# Patient Record
Sex: Male | Born: 1965 | Race: White | Hispanic: No | Marital: Married | Smoking: Never smoker
Health system: Southern US, Community
[De-identification: ages and names within clinical notes are randomized; demographics above are authoritative.]

## PROBLEM LIST (undated history)

## (undated) DIAGNOSIS — E785 Hyperlipidemia, unspecified: Secondary | ICD-10-CM

## (undated) HISTORY — PX: SHOULDER SURGERY: SHX246

---

## 2011-02-15 ENCOUNTER — Ambulatory Visit (HOSPITAL_BASED_OUTPATIENT_CLINIC_OR_DEPARTMENT_OTHER)
Admission: RE | Admit: 2011-02-15 | Discharge: 2011-02-15 | Disposition: A | Discharge status: Home / Self Care | Payer: Worker's Compensation | Source: Ambulatory Visit | Attending: Specialist | Admitting: Specialist

## 2011-02-15 DIAGNOSIS — M25819 Other specified joint disorders, unspecified shoulder: Secondary | ICD-10-CM | POA: Insufficient documentation

## 2011-02-21 ENCOUNTER — Other Ambulatory Visit (HOSPITAL_COMMUNITY): Payer: Self-pay | Admitting: Specialist

## 2011-02-21 ENCOUNTER — Ambulatory Visit (HOSPITAL_COMMUNITY)
Admission: RE | Admit: 2011-02-21 | Discharge: 2011-02-21 | Disposition: A | Discharge status: Home / Self Care | Payer: Worker's Compensation | Source: Ambulatory Visit | Attending: Specialist | Admitting: Specialist

## 2011-02-21 ENCOUNTER — Other Ambulatory Visit: Payer: Self-pay | Admitting: Specialist

## 2011-02-21 ENCOUNTER — Encounter (HOSPITAL_COMMUNITY): Payer: Worker's Compensation

## 2011-02-21 DIAGNOSIS — M7541 Impingement syndrome of right shoulder: Secondary | ICD-10-CM

## 2011-02-21 DIAGNOSIS — Z01818 Encounter for other preprocedural examination: Secondary | ICD-10-CM | POA: Insufficient documentation

## 2011-02-21 DIAGNOSIS — Z01812 Encounter for preprocedural laboratory examination: Secondary | ICD-10-CM | POA: Insufficient documentation

## 2011-02-21 LAB — CBC
Hemoglobin: 14.2 g/dL (ref 13.0–17.0)
MCH: 31.5 pg (ref 26.0–34.0)
MCHC: 33.3 g/dL (ref 30.0–36.0)
Platelets: 331 10*3/uL (ref 150–400)
RBC: 4.51 MIL/uL (ref 4.22–5.81)

## 2011-02-21 LAB — SURGICAL PCR SCREEN
MRSA, PCR: NEGATIVE
Staphylococcus aureus: NEGATIVE

## 2011-02-21 LAB — BASIC METABOLIC PANEL
Calcium: 9.1 mg/dL (ref 8.4–10.5)
Creatinine, Ser: 0.62 mg/dL (ref 0.4–1.5)
GFR calc Af Amer: >60 mL/min (ref >60)
GFR calc non Af Amer: >60 mL/min (ref >60)
Sodium: 140 mEq/L (ref 135–145)

## 2011-02-28 ENCOUNTER — Ambulatory Visit (HOSPITAL_COMMUNITY)
Admission: RE | Admit: 2011-02-28 | Discharge: 2011-02-28 | Disposition: A | Discharge status: Home / Self Care | Payer: Worker's Compensation | Source: Ambulatory Visit | Attending: Specialist | Admitting: Specialist

## 2011-02-28 DIAGNOSIS — M719 Bursopathy, unspecified: Secondary | ICD-10-CM | POA: Insufficient documentation

## 2011-02-28 DIAGNOSIS — M659 Unspecified synovitis and tenosynovitis, unspecified site: Secondary | ICD-10-CM | POA: Insufficient documentation

## 2011-02-28 DIAGNOSIS — M25819 Other specified joint disorders, unspecified shoulder: Secondary | ICD-10-CM | POA: Insufficient documentation

## 2011-02-28 DIAGNOSIS — Z01812 Encounter for preprocedural laboratory examination: Secondary | ICD-10-CM | POA: Insufficient documentation

## 2011-02-28 DIAGNOSIS — M67919 Unspecified disorder of synovium and tendon, unspecified shoulder: Secondary | ICD-10-CM | POA: Insufficient documentation

## 2011-02-28 DIAGNOSIS — M24119 Other articular cartilage disorders, unspecified shoulder: Secondary | ICD-10-CM | POA: Insufficient documentation

## 2011-02-28 DIAGNOSIS — E119 Type 2 diabetes mellitus without complications: Secondary | ICD-10-CM | POA: Insufficient documentation

## 2011-02-28 LAB — GLUCOSE, CAPILLARY: Glucose-Capillary: 146 mg/dL — ABNORMAL HIGH (ref 70–99)

## 2011-03-11 NOTE — Op Note (Signed)
NAME:  Shawn Anthony, Shawn Anthony                 ACCOUNT NO.:  [id number]  MEDICAL RECORD NO.:  [id number]           PATIENT TYPE:  O  LOCATION:  DAYL                         FACILITY:  [facility]  PHYSICIAN:  Jene Every, M.D.    DATE OF BIRTH:  [birth year]  DATE OF PROCEDURE: DATE OF DISCHARGE:  02/28/2011                              OPERATIVE REPORT   PREOPERATIVE DIAGNOSIS:  Impingement syndrome and rotator cuff tear of the right shoulder.  POSTOPERATIVE DIAGNOSIS:  Large tear of the rotator cuff, labral tear.  PROCEDURE PERFORMED: 1. Right shoulder arthroscopy, debridement of superior and anterior     labral tear. 2. Subacromial decompression, bursectomy, debridement of partial tear     of the rotator cuff .  ANESTHESIA:  General.  BRIEF HISTORY:  Impingement pain, MRI indicating __________ bursitis, tendonitis, full rotator cuff tear with consistent pain despite conservative treatment.  He was indicated for arthroscopic evaluation, subacromial decompression, bursectomy.  Risks and benefits discussed including bleeding, infection, no change in symptoms, worsening symptoms, need for repeat debridement, DVT, PE, anesthetic complications, etc.  TECHNIQUE:  With the patient in supine position and beach-chair, after induction of adequate general anesthesia, 2 g of Kefzol, right shoulder and upper extremities were prepped and draped in usual sterile fashion. Surgical marker was utilized to delineate acromion and AC joint and coracoid.  Standard posterolateral and anterolateral incisions were made through the skin only for portal access. The arm was in 70/30 position and gentle traction and an arthroscopic cannula was then inserted through the glenohumeral joint in line with coracoid penetrating the capsule atraumatically.  Irrigant was then utilized to insufflate the joint and under direct visualization, an 18-gauge needle was inserted anteriorly to localize an anterior portal between the  coracoid and anterolateral aspect of the acromion.  Just underneath the biceps tendon, we made a small incision in the skin only and advanced the cannula beneath biceps tendons into the glenohumeral space.  There was significant degenerative fraying of the labrum superiorly and anteriorly.  This was debrided to a stable base and probed.  There was no detachment of the labrum.  Biceps was unremarkable.  There was some minimal fraying of the rotator cuff from beneath this vantage point. Subscapularis was unremarkable.  Minimal of chondral lesions of the glenohumeral joint.  After examining the labrum to determine instability, we redirected camera in the subacromial space and triangulating with a lateral portal in the subacromial space, noted was exuberant hypertrophic bursitis and synovitis.  We introduced the shaver laterally and performed a full bursectomy anteriorly, posteriorly, laterally, and superiorly with some shaving anterolaterally.  There was a minimal spur at the anterolateral aspect of the acromion.  This was shaved.  There was a fair amount of subacromial space.  We did not feel that full release of the __________ ligament was required.  After full bursectomy, there was just small fraying of the anterolateral aspect of the rotator cuff near the insertion of supraspinatus.  This was lightly debrided to good bleeding tissue, may be 10% of the rotator cuff was involved.  It was extensively probed to determine that.  Good space was noted from bursectomy that was performed for evaluation and no further pathology, arthroscopic intervention and therefore, removed all instrumentation, portals were closed with 4-0 nylon simple sutures. Marcaine 0.25% with epinephrine was infiltrated in the joint and dressed accordingly, placed a sling, extubated without difficulty and transported to the recovery room in satisfactory condition.  The patient tolerated the procedure well.  No  complications.     Jene Every, M.D.     Cordelia Pen  D:  02/28/2011  T:  03/01/2011  Job:  [id number]  Electronically Signed by Jene Every M.D. on 03/11/2011 02:24:15 PM

## 2011-12-11 ENCOUNTER — Encounter (HOSPITAL_COMMUNITY): Payer: Self-pay | Admitting: *Deleted

## 2011-12-11 ENCOUNTER — Emergency Department (INDEPENDENT_AMBULATORY_CARE_PROVIDER_SITE_OTHER)
Admission: EM | Admit: 2011-12-11 | Discharge: 2011-12-11 | Disposition: A | Discharge status: Home / Self Care | Payer: BC Managed Care – PPO | Source: Home / Self Care | Attending: Family Medicine | Admitting: Family Medicine

## 2011-12-11 DIAGNOSIS — J310 Chronic rhinitis: Secondary | ICD-10-CM

## 2011-12-11 MED ORDER — HYDROCOD POLST-CHLORPHEN POLST 10-8 MG/5ML PO LQCR: 5.0000 mL | Freq: Two times a day (BID) | ORAL | Status: DC | PRN

## 2011-12-11 MED ORDER — FLUTICASONE PROPIONATE 50 MCG/ACT NA SUSP: 2.0000 | Freq: Every day | NASAL | Status: DC

## 2011-12-11 NOTE — ED Provider Notes (Signed)
History     CSN: [id number]  Arrival date & time 12/11/11  1228   First MD Initiated Contact with Patient 12/11/11 1342      Chief Complaint  Patient presents with  . Cough    (Consider location/radiation/quality/duration/timing/severity/associated sxs/prior treatment) HPI Comments: Shawn Anthony presents for evaluation of a persistent nonproductive cough over the last several weeks. He is been seen at to urgent care prior to this visit. On the first visit he was diagnosed with pneumonia and given antibiotics, including an injection. He was also given an injection of steroid at that time as well. On the second visit he was given a second round of antibiotics and an albuterol inhaler. He reports fever at the onset of symptoms during the first visit, but none since. He now reports nasal congestion, postnasal drainage, and persistent cough. He also reports a history of seasonal allergies.  Patient is a 46 y.o. male presenting with cough. The history is provided by the patient.  Cough This is a new problem. The current episode started more than 1 week ago. The problem occurs constantly. The problem has not changed since onset.The cough is non-productive. He is not a smoker.    Past Medical History  Diagnosis Date  . Diabetes mellitus     Past Surgical History  Procedure Date  . Shoulder surgery     History reviewed. No pertinent family history.  History  Substance Use Topics  . Smoking status: Not on file  . Smokeless tobacco: Not on file  . Alcohol Use: No      Review of Systems  Constitutional: Negative.   HENT: Positive for congestion.   Eyes: Negative.   Respiratory: Positive for cough.   Cardiovascular: Negative.   Gastrointestinal: Negative.   Genitourinary: Negative.   Musculoskeletal: Negative.   Skin: Negative.   Neurological: Negative.     Allergies  Review of patient's allergies indicates no known allergies.  Home Medications   Current Outpatient Rx  Name  Route Sig Dispense Refill  . ALBUTEROL SULFATE HFA 108 (90 BASE) MCG/ACT IN AERS Inhalation Inhale 2 puffs into the lungs every 6 (six) hours as needed.    Marland Kitchen GLIMEPIRIDE 2 MG PO TABS Oral Take 2 mg by mouth daily before breakfast.    . LEVOFLOXACIN 750 MG PO TABS Oral Take 750 mg by mouth daily.    Marland Kitchen METFORMIN HCL 500 MG PO TABS Oral Take 500 mg by mouth 2 (two) times daily with a meal.    . SIMVASTATIN 10 MG PO TABS Oral Take 10 mg by mouth at bedtime.    Marland Kitchen HYDROCOD POLST-CPM POLST ER 10-8 MG/5ML PO LQCR Oral Take 5 mLs by mouth every 12 (twelve) hours as needed. 140 mL 0  . FLUTICASONE PROPIONATE 50 MCG/ACT NA SUSP Nasal Place 2 sprays into the nose daily. 16 g 2    BP 105/68  Pulse 123  Temp(Src) 99.5 F (37.5 C) (Oral)  Resp 22  SpO2 95%  Physical Exam  Nursing note and vitals reviewed. Constitutional: He is oriented to person, place, and time. He appears well-developed and well-nourished.  HENT:  Head: Normocephalic and atraumatic.  Right Ear: Tympanic membrane is retracted.  Left Ear: Tympanic membrane is retracted.  Mouth/Throat: Uvula is midline, oropharynx is clear and moist and mucous membranes are normal.  Eyes: EOM are normal.  Neck: Normal range of motion.  Cardiovascular: Normal rate and regular rhythm.   Pulmonary/Chest: Effort normal and breath sounds normal. He has no wheezes.  He has no rales.  Musculoskeletal: Normal range of motion.  Neurological: He is alert and oriented to person, place, and time.  Skin: Skin is warm and dry.  Psychiatric: His behavior is normal.    ED Course  Procedures (including critical care time)  Labs Reviewed - No data to display No results found.   1. Rhinitis       MDM  rx given for fluticasone and Tussionex Pennkinetic syrup        Richardo Priest, MD 12/11/11 1517

## 2011-12-11 NOTE — ED Notes (Signed)
Pt  Has  Symptoms  Of  A  Non  Productive  Cough            Chest  Congestion          Seen   At  [facility]   Recently  For  pnuemonia            Pt  Was  Given   Anti  Biotic  Injections  As  Well  As  Breathing  tx        -  Pt  Reports  He  Is  Not  Getting  Any  Better

## 2014-06-18 ENCOUNTER — Emergency Department (HOSPITAL_COMMUNITY)
Admission: EM | Admit: 2014-06-18 | Discharge: 2014-06-19 | Disposition: A | Discharge status: Home / Self Care | Payer: 59 | Attending: Emergency Medicine | Admitting: Emergency Medicine

## 2014-06-18 ENCOUNTER — Emergency Department (HOSPITAL_COMMUNITY): Payer: 59

## 2014-06-18 ENCOUNTER — Encounter (HOSPITAL_COMMUNITY): Payer: Self-pay | Admitting: Emergency Medicine

## 2014-06-18 DIAGNOSIS — S20219A Contusion of unspecified front wall of thorax, initial encounter: Secondary | ICD-10-CM | POA: Insufficient documentation

## 2014-06-18 DIAGNOSIS — S20212A Contusion of left front wall of thorax, initial encounter: Secondary | ICD-10-CM

## 2014-06-18 DIAGNOSIS — S46909A Unspecified injury of unspecified muscle, fascia and tendon at shoulder and upper arm level, unspecified arm, initial encounter: Secondary | ICD-10-CM | POA: Insufficient documentation

## 2014-06-18 DIAGNOSIS — Y9389 Activity, other specified: Secondary | ICD-10-CM | POA: Insufficient documentation

## 2014-06-18 DIAGNOSIS — IMO0002 Reserved for concepts with insufficient information to code with codable children: Secondary | ICD-10-CM | POA: Insufficient documentation

## 2014-06-18 DIAGNOSIS — S4980XA Other specified injuries of shoulder and upper arm, unspecified arm, initial encounter: Secondary | ICD-10-CM | POA: Diagnosis present

## 2014-06-18 DIAGNOSIS — E119 Type 2 diabetes mellitus without complications: Secondary | ICD-10-CM | POA: Diagnosis not present

## 2014-06-18 DIAGNOSIS — Z79899 Other long term (current) drug therapy: Secondary | ICD-10-CM | POA: Diagnosis not present

## 2014-06-18 DIAGNOSIS — S46912A Strain of unspecified muscle, fascia and tendon at shoulder and upper arm level, left arm, initial encounter: Secondary | ICD-10-CM

## 2014-06-18 DIAGNOSIS — T148XXA Other injury of unspecified body region, initial encounter: Secondary | ICD-10-CM

## 2014-06-18 DIAGNOSIS — Y92410 Unspecified street and highway as the place of occurrence of the external cause: Secondary | ICD-10-CM | POA: Insufficient documentation
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
## 2014-06-19 NOTE — ED Provider Notes (Signed)
Medical screening examination/treatment/procedure(s) were performed by non-physician practitioner and as supervising physician I was immediately available for consultation/collaboration.   EKG Interpretation None       Shamari Trostel, MD 06/19/14 0053 

## 2017-11-16 ENCOUNTER — Inpatient Hospital Stay (HOSPITAL_COMMUNITY)
Admission: EM | Admit: 2017-11-16 | Discharge: 2017-11-23 | DRG: 638 | Disposition: A | Discharge status: Home / Self Care | Payer: BC Managed Care – PPO | Attending: Internal Medicine | Admitting: Internal Medicine

## 2017-11-16 ENCOUNTER — Encounter (HOSPITAL_COMMUNITY): Payer: Self-pay

## 2017-11-16 ENCOUNTER — Emergency Department (HOSPITAL_COMMUNITY): Payer: BC Managed Care – PPO

## 2017-11-16 ENCOUNTER — Inpatient Hospital Stay (HOSPITAL_COMMUNITY): Payer: BC Managed Care – PPO

## 2017-11-16 DIAGNOSIS — K219 Gastro-esophageal reflux disease without esophagitis: Secondary | ICD-10-CM | POA: Diagnosis present

## 2017-11-16 DIAGNOSIS — E878 Other disorders of electrolyte and fluid balance, not elsewhere classified: Secondary | ICD-10-CM | POA: Diagnosis present

## 2017-11-16 DIAGNOSIS — R000 Tachycardia, unspecified: Secondary | ICD-10-CM | POA: Diagnosis present

## 2017-11-16 DIAGNOSIS — R112 Nausea with vomiting, unspecified: Secondary | ICD-10-CM | POA: Diagnosis not present

## 2017-11-16 DIAGNOSIS — E1110 Type 2 diabetes mellitus with ketoacidosis without coma: Secondary | ICD-10-CM | POA: Diagnosis present

## 2017-11-16 DIAGNOSIS — E0810 Diabetes mellitus due to underlying condition with ketoacidosis without coma: Secondary | ICD-10-CM

## 2017-11-16 DIAGNOSIS — E860 Dehydration: Secondary | ICD-10-CM

## 2017-11-16 DIAGNOSIS — Z79899 Other long term (current) drug therapy: Secondary | ICD-10-CM | POA: Diagnosis not present

## 2017-11-16 DIAGNOSIS — D72829 Elevated white blood cell count, unspecified: Secondary | ICD-10-CM | POA: Diagnosis present

## 2017-11-16 DIAGNOSIS — Z7984 Long term (current) use of oral hypoglycemic drugs: Secondary | ICD-10-CM

## 2017-11-16 DIAGNOSIS — Z818 Family history of other mental and behavioral disorders: Secondary | ICD-10-CM

## 2017-11-16 DIAGNOSIS — E876 Hypokalemia: Secondary | ICD-10-CM | POA: Diagnosis present

## 2017-11-16 DIAGNOSIS — Z6841 Body Mass Index (BMI) 40.0 and over, adult: Secondary | ICD-10-CM | POA: Diagnosis not present

## 2017-11-16 DIAGNOSIS — K760 Fatty (change of) liver, not elsewhere classified: Secondary | ICD-10-CM | POA: Diagnosis present

## 2017-11-16 DIAGNOSIS — E1169 Type 2 diabetes mellitus with other specified complication: Secondary | ICD-10-CM | POA: Diagnosis not present

## 2017-11-16 DIAGNOSIS — G9340 Encephalopathy, unspecified: Secondary | ICD-10-CM | POA: Diagnosis present

## 2017-11-16 DIAGNOSIS — E785 Hyperlipidemia, unspecified: Secondary | ICD-10-CM | POA: Diagnosis present

## 2017-11-16 DIAGNOSIS — G9349 Other encephalopathy: Secondary | ICD-10-CM | POA: Diagnosis present

## 2017-11-16 HISTORY — DX: Hyperlipidemia, unspecified: E78.5

## 2017-11-16 LAB — URINALYSIS, ROUTINE W REFLEX MICROSCOPIC
BILIRUBIN URINE: NEGATIVE
Glucose, UA: >=500 mg/dL — AB
Hgb urine dipstick: NEGATIVE
Ketones, ur: 80 mg/dL — AB
LEUKOCYTES UA: NEGATIVE
Nitrite: NEGATIVE
PROTEIN: 30 mg/dL — AB
RBC / HPF: NONE SEEN RBC/hpf (ref 0–5)
Specific Gravity, Urine: 1.024 (ref 1.005–1.030)
pH: 5 (ref 5.0–8.0)

## 2017-11-16 LAB — BASIC METABOLIC PANEL
Anion gap: 19 — ABNORMAL HIGH (ref 5–15)
BUN: 24 mg/dL — AB (ref 6–20)
CALCIUM: 7.9 mg/dL — AB (ref 8.9–10.3)
CO2: 7 mmol/L — AB (ref 22–32)
Chloride: 103 mmol/L (ref 101–111)
Creatinine, Ser: 0.99 mg/dL (ref 0.61–1.24)
GFR calc Af Amer: >60 mL/min (ref >60)
GLUCOSE: 263 mg/dL — AB (ref 65–99)
Potassium: 5.1 mmol/L (ref 3.5–5.1)
Sodium: 129 mmol/L — ABNORMAL LOW (ref 135–145)

## 2017-11-16 LAB — CBC WITH DIFFERENTIAL/PLATELET
BASOS ABS: 0 10*3/uL (ref 0.0–0.1)
BASOS PCT: 0 %
EOS PCT: 0 %
Eosinophils Absolute: 0 10*3/uL (ref 0.0–0.7)
HCT: 50.5 % (ref 39.0–52.0)
Hemoglobin: 17 g/dL (ref 13.0–17.0)
LYMPHS PCT: 7 %
Lymphs Abs: 1.6 10*3/uL (ref 0.7–4.0)
MCH: 33 pg (ref 26.0–34.0)
MCHC: 33.7 g/dL (ref 30.0–36.0)
MCV: 98.1 fL (ref 78.0–100.0)
Monocytes Absolute: 1.4 10*3/uL — ABNORMAL HIGH (ref 0.1–1.0)
Monocytes Relative: 6 %
NEUTROS ABS: 21.8 10*3/uL — AB (ref 1.7–7.7)
Neutrophils Relative %: 87 %
PLATELETS: 385 10*3/uL (ref 150–400)
RBC: 5.15 MIL/uL (ref 4.22–5.81)
RDW: 13.1 % (ref 11.5–15.5)
WBC: 24.9 10*3/uL — AB (ref 4.0–10.5)

## 2017-11-16 LAB — COMPREHENSIVE METABOLIC PANEL
ALBUMIN: 4.3 g/dL (ref 3.5–5.0)
ALT: 23 U/L (ref 17–63)
AST: 17 U/L (ref 15–41)
Alkaline Phosphatase: 98 U/L (ref 38–126)
BUN: 26 mg/dL — AB (ref 6–20)
CHLORIDE: 102 mmol/L (ref 101–111)
CREATININE: 1.17 mg/dL (ref 0.61–1.24)
Calcium: 9 mg/dL (ref 8.9–10.3)
GFR calc Af Amer: >60 mL/min (ref >60)
GLUCOSE: 346 mg/dL — AB (ref 65–99)
Potassium: 5 mmol/L (ref 3.5–5.1)
Sodium: 132 mmol/L — ABNORMAL LOW (ref 135–145)
Total Bilirubin: 1.4 mg/dL — ABNORMAL HIGH (ref 0.3–1.2)
Total Protein: 9.1 g/dL — ABNORMAL HIGH (ref 6.5–8.1)

## 2017-11-16 LAB — LIPASE, BLOOD: Lipase: 20 U/L (ref 11–51)

## 2017-11-16 LAB — I-STAT TROPONIN, ED: Troponin i, poc: 0 ng/mL (ref 0.00–0.08)

## 2017-11-16 LAB — CBG MONITORING, ED
GLUCOSE-CAPILLARY: 312 mg/dL — AB (ref 65–99)
Glucose-Capillary: 230 mg/dL — ABNORMAL HIGH (ref 65–99)

## 2017-11-16 MED ORDER — IOPAMIDOL (ISOVUE-300) INJECTION 61%
INTRAVENOUS | Status: AC
  Administered 2017-11-16: 100 mL via INTRAVENOUS
  Filled 2017-11-16: qty 100

## 2017-11-16 MED ORDER — SODIUM CHLORIDE 0.9 % IV SOLN
INTRAVENOUS | Status: DC
  Administered 2017-11-16: 2.5 [IU]/h via INTRAVENOUS
  Filled 2017-11-16: qty 1

## 2017-11-16 MED ORDER — SODIUM CHLORIDE 0.9 % IV BOLUS (SEPSIS)
1000.0000 mL | Freq: Once | INTRAVENOUS | Status: AC
  Administered 2017-11-16: 1000 mL via INTRAVENOUS

## 2017-11-16 MED ORDER — ONDANSETRON HCL 4 MG/2ML IJ SOLN
4.0000 mg | Freq: Once | INTRAMUSCULAR | Status: AC
  Administered 2017-11-16: 4 mg via INTRAVENOUS
  Filled 2017-11-16: qty 2

## 2017-11-16 MED ORDER — DEXTROSE-NACL 5-0.45 % IV SOLN
INTRAVENOUS | Status: DC
  Administered 2017-11-17: via INTRAVENOUS

## 2017-11-16 MED ORDER — HYDROMORPHONE HCL 1 MG/ML IJ SOLN
1.0000 mg | Freq: Once | INTRAMUSCULAR | Status: AC
  Administered 2017-11-16: 1 mg via INTRAVENOUS
  Filled 2017-11-16: qty 1

## 2017-11-16 NOTE — ED Provider Notes (Addendum)
[facility]-EMERGENCY DEPT Provider Note   CSN: [id number] Arrival date & time: 11/16/17  1922     History   Chief Complaint Chief Complaint  Patient presents with  . Abdominal Pain    HPI Shawn Anthony is a 52 y.o. male.  Patient c/o mid abd pain and nausea/vomiting for the past 3 days. Several episodes emesis, emesis clear and/or color recently ingested fluids. abd pain constant, moderate, dull, non radiating. No hx same symptoms. Last bm 2-3 days ago. No prior abd surgery. No hx hernia. No gu c/o. Denies chest pain or sob. No cough or uri c/o. No fever or chills.     Abdominal Pain   Associated symptoms include vomiting. Pertinent negatives include fever and headaches.    Past Medical History:  Diagnosis Date  . Diabetes mellitus     There are no active problems to display for this patient.   Past Surgical History:  Procedure Laterality Date  . SHOULDER SURGERY         Home Medications    Prior to Admission medications   Medication Sig Start Date End Date Taking? Authorizing Provider  Canagliflozin (INVOKANA) 100 MG TABS Take 100 mg by mouth daily.    [provider]  glimepiride (AMARYL) 2 MG tablet Take 2 mg by mouth daily before breakfast.    [provider]  glipiZIDE (GLUCOTROL) 5 MG tablet Take 5 mg by mouth daily before breakfast.    [provider]  metFORMIN (GLUCOPHAGE) 500 MG tablet Take 500 mg by mouth 2 (two) times daily with a meal.    [provider]  simvastatin (ZOCOR) 10 MG tablet Take 10 mg by mouth at bedtime.    [provider]    Family History History reviewed. No pertinent family history.  Social History Social History   Tobacco Use  . Smoking status: Never Smoker  . Smokeless tobacco: Never Used  Substance Use Topics  . Alcohol use: No  . Drug use: No     Allergies   Patient has no known allergies.   Review of Systems Review of Systems    Constitutional: Negative for fever.  HENT: Negative for sore throat.   Eyes: Negative for redness.  Respiratory: Negative for shortness of breath.   Cardiovascular: Negative for chest pain.  Gastrointestinal: Positive for abdominal pain and vomiting.  Endocrine: Negative for polyuria.  Genitourinary: Negative for flank pain.  Musculoskeletal: Negative for back pain and neck pain.  Skin: Negative for rash.  Neurological: Negative for headaches.  Hematological: Does not bruise/bleed easily.  Psychiatric/Behavioral: Negative for confusion.     Physical Exam Updated Vital Signs BP (!) 152/80 (BP Location: Left Arm)   Pulse (!) 121   Temp (!) 97.5 F (36.4 C) (Oral)   Resp (!) 26   SpO2 100%   Physical Exam  Constitutional: He appears well-developed and well-nourished. No distress.  HENT:  Mouth/Throat: Oropharynx is clear and moist.  Eyes: Conjunctivae are normal. No scleral icterus.  Neck: Neck supple. No tracheal deviation present.  Cardiovascular: Normal rate, normal heart sounds and intact distal pulses. Exam reveals no gallop and no friction rub.  No murmur heard. Tachycardic.   Pulmonary/Chest: Effort normal and breath sounds normal. No accessory muscle usage. No respiratory distress.  Abdominal: Soft. Bowel sounds are normal. He exhibits no distension. There is tenderness. No hernia.  Mid to upper abd tenderness, moderate.   Genitourinary:  Genitourinary Comments: No cva tenderness  Musculoskeletal: He exhibits  no edema.  Neurological: He is alert.  Skin: Skin is warm and dry. No rash noted. He is not diaphoretic.  Psychiatric: He has a normal mood and affect.  Nursing note and vitals reviewed.    ED Treatments / Results  Labs (all labs ordered are listed, but only abnormal results are displayed) Results for orders placed or performed during the hospital encounter of 11/16/17  CBC with Differential/Platelet  Result Value Ref Range   WBC 24.9 (H) 4.0 - 10.5  K/uL   RBC 5.15 4.22 - 5.81 MIL/uL   Hemoglobin 17.0 13.0 - 17.0 g/dL   HCT 40.9 81.1 - 91.4 %   MCV 98.1 78.0 - 100.0 fL   MCH 33.0 26.0 - 34.0 pg   MCHC 33.7 30.0 - 36.0 g/dL   RDW 78.2 95.6 - 21.3 %   Platelets 385 150 - 400 K/uL   Neutrophils Relative % 87 %   Neutro Abs 21.8 (H) 1.7 - 7.7 K/uL   Lymphocytes Relative 7 %   Lymphs Abs 1.6 0.7 - 4.0 K/uL   Monocytes Relative 6 %   Monocytes Absolute 1.4 (H) 0.1 - 1.0 K/uL   Eosinophils Relative 0 %   Eosinophils Absolute 0.0 0.0 - 0.7 K/uL   Basophils Relative 0 %   Basophils Absolute 0.0 0.0 - 0.1 K/uL  Comprehensive metabolic panel  Result Value Ref Range   Sodium 132 (L) 135 - 145 mmol/L   Potassium 5.0 3.5 - 5.1 mmol/L   Chloride 102 101 - 111 mmol/L   CO2 <7 (L) 22 - 32 mmol/L   Glucose, Bld 346 (H) 65 - 99 mg/dL   BUN 26 (H) 6 - 20 mg/dL   Creatinine, Ser 0.86 0.61 - 1.24 mg/dL   Calcium 9.0 8.9 - 57.8 mg/dL   Total Protein 9.1 (H) 6.5 - 8.1 g/dL   Albumin 4.3 3.5 - 5.0 g/dL   AST 17 15 - 41 U/L   ALT 23 17 - 63 U/L   Alkaline Phosphatase 98 38 - 126 U/L   Total Bilirubin 1.4 (H) 0.3 - 1.2 mg/dL   GFR calc non Af Amer >60 >60 mL/min   GFR calc Af Amer >60 >60 mL/min   Anion gap NOT CALCULATED 5 - 15  Lipase, blood  Result Value Ref Range   Lipase 20 11 - 51 U/L  Urinalysis, Routine w reflex microscopic  Result Value Ref Range   Color, Urine YELLOW YELLOW   APPearance CLEAR CLEAR   Specific Gravity, Urine 1.024 1.005 - 1.030   pH 5.0 5.0 - 8.0   Glucose, UA >=500 (A) NEGATIVE mg/dL   Hgb urine dipstick NEGATIVE NEGATIVE   Bilirubin Urine NEGATIVE NEGATIVE   Ketones, ur 80 (A) NEGATIVE mg/dL   Protein, ur 30 (A) NEGATIVE mg/dL   Nitrite NEGATIVE NEGATIVE   Leukocytes, UA NEGATIVE NEGATIVE   RBC / HPF NONE SEEN 0 - 5 RBC/hpf   WBC, UA 0-5 0 - 5 WBC/hpf   Bacteria, UA RARE (A) NONE SEEN   Squamous Epithelial / LPF 0-5 (A) NONE SEEN   Mucus PRESENT   I-stat troponin, ED  Result Value Ref Range    Troponin i, poc 0.00 0.00 - 0.08 ng/mL   Comment 3          CBG monitoring, ED  Result Value Ref Range   Glucose-Capillary 312 (H) 65 - 99 mg/dL    EKG  EKG Interpretation  Date/Time:  Sunday November 16 2017 19:32:08 EST  Ventricular Rate:  120 PR Interval:    QRS Duration: 85 QT Interval:  327 QTC Calculation: 462 R Axis:   26 Text Interpretation:  Sinus tachycardia No previous tracing Confirmed by Cathren LaineSteinl, Jayel Scaduto ([id number]) on 11/16/2017 8:25:57 PM       Radiology Ct Abdomen Pelvis W Contrast  Result Date: 11/16/2017 CLINICAL DATA:  Diffuse abdominal pain worsening for 2 days. EXAM: CT ABDOMEN AND PELVIS WITH CONTRAST TECHNIQUE: Multidetector CT imaging of the abdomen and pelvis was performed using the standard protocol following bolus administration of intravenous contrast. CONTRAST:  100 cc ISOVUE-300 IOPAMIDOL (ISOVUE-300) INJECTION 61% COMPARISON:  None. FINDINGS: Lower chest: No significant pulmonary nodules or acute consolidative airspace disease. Small amount of fluid is noted in the lower thoracic esophagus. Hepatobiliary: Diffuse hepatic steatosis. No definite liver surface irregularity. No liver mass. Normal gallbladder with no radiopaque cholelithiasis. No biliary ductal dilatation. Pancreas: Normal, with no mass or duct dilation. Spleen: Normal size. No mass. Adrenals/Urinary Tract: Normal adrenals. Normal kidneys with no hydronephrosis and no renal mass. Normal bladder. Stomach/Bowel: Stomach is mildly distended with fluid. No definite gastric wall thickening. Normal caliber small bowel with no small bowel wall thickening. Normal appendix. Moderate stool throughout the large bowel. No large bowel wall thickening or pericolonic fat stranding. Vascular/Lymphatic: Normal caliber abdominal aorta. Patent portal, splenic, hepatic and renal veins. No pathologically enlarged lymph nodes in the abdomen or pelvis. Reproductive: Normal size prostate. Other: No pneumoperitoneum, ascites or focal  fluid collection. Musculoskeletal: No aggressive appearing focal osseous lesions. Mild thoracolumbar spondylosis. IMPRESSION: 1. No evidence of bowel obstruction or acute bowel inflammation. Normal appendix. 2. Stomach is mildly distended with fluid. No definite gastric wall thickening. 3. Fluid in the lower thoracic esophagus, suggesting esophageal dysmotility and/or gastroesophageal reflux. 4. Diffuse hepatic steatosis. Electronically Signed   By: Delbert PhenixJason A Poff M.D.   On: 11/16/2017 22:34    Procedures Procedures (including critical care time)  Medications Ordered in ED Medications  sodium chloride 0.9 % bolus 1,000 mL (not administered)     Initial Impression / Assessment and Plan / ED Course  I have reviewed the triage vital signs and the nursing notes.  Pertinent labs & imaging results that were available during my care of the patient were reviewed by me and considered in my medical decision making (see chart for details).  Iv ns bolus. Labs. Imaging study.  Dilaudid 1 mg iv. zofran iv.  Reviewed nursing notes and prior charts for additional history.   Awaiting labs.  Additional ns iv.   Ct pending.  From labs, glucose high and hco3 very low c/w dka. Insulin gtt via glucose stabilizer.  Will repeat bmet.   Hospitalists consulted for admission. Discussed pt with Dr Selena BattenKim - he will admit  CT, no sbo or acute infection.  CRITICAL CARE  RE: tachycardia, severe dehydration, diabetic ketoacidosis.  Performed by: Suzi RootsKevin E Zayveon Raschke Total critical care time: 40 minutes Critical care time was exclusive of separately billable procedures and treating other patients. Critical care was necessary to treat or prevent imminent or life-threatening deterioration. Critical care was time spent personally by me on the following activities: development of treatment plan with patient and/or surrogate as well as nursing, discussions with consultants, evaluation of patient's response to treatment,  examination of patient, obtaining history from patient or surrogate, ordering and performing treatments and interventions, ordering and review of laboratory studies, ordering and review of radiographic studies, pulse oximetry and re-evaluation of patient's condition.    Final Clinical Impressions(s) /  ED Diagnoses   Final diagnoses:  None    ED Discharge Orders    None           Cathren Laine, MD 11/16/17 2255

## 2017-11-16 NOTE — ED Notes (Signed)
ED TO INPATIENT HANDOFF REPORT  Name/Age/Gender Shawn Anthony 52 y.o. male  Code Status Code Status History    This patient does not have a recorded code status. Please follow your organizational policy for patients in this situation.      Home/SNF/Other Home  Chief Complaint abd pain  Level of Care/Admitting Diagnosis ED Disposition    ED Disposition Condition Comment   Admit  Hospital Area: [place] [[id number]]  Level of Care: Stepdown [14]  Admit to SDU based on following criteria: Severe physiological/psychological symptoms:  Any diagnosis requiring assessment & intervention at least every 4 hours on an ongoing basis to obtain desired patient outcomes including stability and rehabilitation  Diagnosis: DKA (diabetic ketoacidoses) [facility]) [[id number]]  Admitting Physician: Jani Gravel [[id number]]  Attending Physician: Jani Gravel [phone]  Estimated length of stay: past midnight tomorrow  Certification:: I certify this patient will need inpatient services for at least 2 midnights  PT Class (Do Not Modify): Inpatient [101]  PT Acc Code (Do Not Modify): Private [1]       Medical History Past Medical History:  Diagnosis Date  . Diabetes mellitus   . Hyperlipidemia     Allergies No Known Allergies  IV Location/Drains/Wounds Patient Lines/Drains/Airways Status   Active Line/Drains/Airways    Name:   Placement date:   Placement time:   Site:   Days:   Peripheral IV 11/16/17 Left Hand   11/16/17    1938    Hand   less than 1          Labs/Imaging Results for orders placed or performed during the hospital encounter of 11/16/17 (from the past 48 hour(s))  Urinalysis, Routine w reflex microscopic     Status: Abnormal   Collection Time: 11/16/17  7:58 PM  Result Value Ref Range   Color, Urine YELLOW YELLOW   APPearance CLEAR CLEAR   Specific Gravity, Urine 1.024 1.005 - 1.030   pH 5.0 5.0 - 8.0   Glucose, UA >=500 (A) NEGATIVE mg/dL   Hgb urine dipstick  NEGATIVE NEGATIVE   Bilirubin Urine NEGATIVE NEGATIVE   Ketones, ur 80 (A) NEGATIVE mg/dL   Protein, ur 30 (A) NEGATIVE mg/dL   Nitrite NEGATIVE NEGATIVE   Leukocytes, UA NEGATIVE NEGATIVE   RBC / HPF NONE SEEN 0 - 5 RBC/hpf   WBC, UA 0-5 0 - 5 WBC/hpf   Bacteria, UA RARE (A) NONE SEEN   Squamous Epithelial / LPF 0-5 (A) NONE SEEN   Mucus PRESENT   CBC with Differential/Platelet     Status: Abnormal   Collection Time: 11/16/17  7:59 PM  Result Value Ref Range   WBC 24.9 (H) 4.0 - 10.5 K/uL   RBC 5.15 4.22 - 5.81 MIL/uL   Hemoglobin 17.0 13.0 - 17.0 g/dL   HCT 50.5 39.0 - 52.0 %   MCV 98.1 78.0 - 100.0 fL   MCH 33.0 26.0 - 34.0 pg   MCHC 33.7 30.0 - 36.0 g/dL   RDW 13.1 11.5 - 15.5 %   Platelets 385 150 - 400 K/uL   Neutrophils Relative % 87 %   Neutro Abs 21.8 (H) 1.7 - 7.7 K/uL   Lymphocytes Relative 7 %   Lymphs Abs 1.6 0.7 - 4.0 K/uL   Monocytes Relative 6 %   Monocytes Absolute 1.4 (H) 0.1 - 1.0 K/uL   Eosinophils Relative 0 %   Eosinophils Absolute 0.0 0.0 - 0.7 K/uL   Basophils Relative 0 %   Basophils Absolute  0.0 0.0 - 0.1 K/uL  Comprehensive metabolic panel     Status: Abnormal   Collection Time: 11/16/17  7:59 PM  Result Value Ref Range   Sodium 132 (L) 135 - 145 mmol/L   Potassium 5.0 3.5 - 5.1 mmol/L   Chloride 102 101 - 111 mmol/L   CO2 <7 (L) 22 - 32 mmol/L   Glucose, Bld 346 (H) 65 - 99 mg/dL   BUN 26 (H) 6 - 20 mg/dL   Creatinine, Ser 1.17 0.61 - 1.24 mg/dL   Calcium 9.0 8.9 - 10.3 mg/dL   Total Protein 9.1 (H) 6.5 - 8.1 g/dL   Albumin 4.3 3.5 - 5.0 g/dL   AST 17 15 - 41 U/L   ALT 23 17 - 63 U/L   Alkaline Phosphatase 98 38 - 126 U/L   Total Bilirubin 1.4 (H) 0.3 - 1.2 mg/dL   GFR calc non Af Amer >60 >60 mL/min   GFR calc Af Amer >60 >60 mL/min    Comment: (NOTE) The eGFR has been calculated using the CKD EPI equation. This calculation has not been validated in all clinical situations. eGFR's persistently <60 mL/min signify possible Chronic  Kidney Disease.    Anion gap NOT CALCULATED 5 - 15  Lipase, blood     Status: None   Collection Time: 11/16/17  7:59 PM  Result Value Ref Range   Lipase 20 11 - 51 U/L  I-stat troponin, ED     Status: None   Collection Time: 11/16/17  8:09 PM  Result Value Ref Range   Troponin i, poc 0.00 0.00 - 0.08 ng/mL   Comment 3            Comment: Due to the release kinetics of cTnI, a negative result within the first hours of the onset of symptoms does not rule out myocardial infarction with certainty. If myocardial infarction is still suspected, repeat the test at appropriate intervals.   CBG monitoring, ED     Status: Abnormal   Collection Time: 11/16/17 10:09 PM  Result Value Ref Range   Glucose-Capillary 312 (H) 65 - 99 mg/dL  CBG monitoring, ED     Status: Abnormal   Collection Time: 11/16/17 11:23 PM  Result Value Ref Range   Glucose-Capillary 230 (H) 65 - 99 mg/dL  Basic metabolic panel     Status: Abnormal   Collection Time: 11/16/17 11:25 PM  Result Value Ref Range   Sodium 129 (L) 135 - 145 mmol/L   Potassium 5.1 3.5 - 5.1 mmol/L   Chloride 103 101 - 111 mmol/L   CO2 7 (L) 22 - 32 mmol/L   Glucose, Bld 263 (H) 65 - 99 mg/dL   BUN 24 (H) 6 - 20 mg/dL   Creatinine, Ser 0.99 0.61 - 1.24 mg/dL   Calcium 7.9 (L) 8.9 - 10.3 mg/dL   GFR calc non Af Amer >60 >60 mL/min   GFR calc Af Amer >60 >60 mL/min    Comment: (NOTE) The eGFR has been calculated using the CKD EPI equation. This calculation has not been validated in all clinical situations. eGFR's persistently <60 mL/min signify possible Chronic Kidney Disease.    Anion gap 19 (H) 5 - 15   Dg Chest 2 View  Result Date: 11/16/2017 CLINICAL DATA:  Chest and abdominal pain. EXAM: CHEST  2 VIEW COMPARISON:  Chest x-[place] 06/18/2014 FINDINGS: The cardiac silhouette, mediastinal and hilar contours are within normal limits and stable. The lungs are clear. No pleural effusion.  The bony thorax is intact. IMPRESSION: No acute  cardiopulmonary findings. Electronically Signed   By: Marijo Sanes M.D.   On: 11/16/2017 23:24   Ct Abdomen Pelvis W Contrast  Result Date: 11/16/2017 CLINICAL DATA:  Diffuse abdominal pain worsening for 2 days. EXAM: CT ABDOMEN AND PELVIS WITH CONTRAST TECHNIQUE: Multidetector CT imaging of the abdomen and pelvis was performed using the standard protocol following bolus administration of intravenous contrast. CONTRAST:  100 cc ISOVUE-300 IOPAMIDOL (ISOVUE-300) INJECTION 61% COMPARISON:  None. FINDINGS: Lower chest: No significant pulmonary nodules or acute consolidative airspace disease. Small amount of fluid is noted in the lower thoracic esophagus. Hepatobiliary: Diffuse hepatic steatosis. No definite liver surface irregularity. No liver mass. Normal gallbladder with no radiopaque cholelithiasis. No biliary ductal dilatation. Pancreas: Normal, with no mass or duct dilation. Spleen: Normal size. No mass. Adrenals/Urinary Tract: Normal adrenals. Normal kidneys with no hydronephrosis and no renal mass. Normal bladder. Stomach/Bowel: Stomach is mildly distended with fluid. No definite gastric wall thickening. Normal caliber small bowel with no small bowel wall thickening. Normal appendix. Moderate stool throughout the large bowel. No large bowel wall thickening or pericolonic fat stranding. Vascular/Lymphatic: Normal caliber abdominal aorta. Patent portal, splenic, hepatic and renal veins. No pathologically enlarged lymph nodes in the abdomen or pelvis. Reproductive: Normal size prostate. Other: No pneumoperitoneum, ascites or focal fluid collection. Musculoskeletal: No aggressive appearing focal osseous lesions. Mild thoracolumbar spondylosis. IMPRESSION: 1. No evidence of bowel obstruction or acute bowel inflammation. Normal appendix. 2. Stomach is mildly distended with fluid. No definite gastric wall thickening. 3. Fluid in the lower thoracic esophagus, suggesting esophageal dysmotility and/or  gastroesophageal reflux. 4. Diffuse hepatic steatosis. Electronically Signed   By: Ilona Sorrel M.D.   On: 11/16/2017 22:34    Pending Labs [employer] (From admission, onward)   Start     Ordered   Signed and Held  HIV antibody (Routine Testing)  Once,   R     Signed and Held   Signed and Held  Basic metabolic panel  STAT Now then every 4 hours ,   STAT     Signed and Held   Signed and Held  Creatinine, serum  (enoxaparin (LOVENOX)    CrCl >/= 30 ml/min)  Weekly,   R    Comments:  while on enoxaparin therapy    Signed and Held   Signed and Held  Troponin I  Now then every 4 hours,   R     Signed and Held   Signed and Held  CBC  Tomorrow morning,   R     Signed and Held   Signed and Held  TSH  Tomorrow morning,   R     Signed and Held      Vitals/Pain Today's Vitals   11/16/17 1928 11/16/17 1932 11/16/17 2211 11/16/17 2217  BP:  (!) 152/80 (!) 148/73   Pulse:  (!) 120 (!) 121   Resp:  (!) 24 (!) 22   Temp:  (!) 97.5 F (36.4 C)    TempSrc:  Oral    SpO2: 100% 100% 100%   PainSc:    2     Isolation Precautions No active isolations  Medications Medications  insulin regular (NOVOLIN R,HUMULIN R) 100 Units in sodium chloride 0.9 % 100 mL (1 Units/mL) infusion (1.7 Units/hr Intravenous Rate/Dose Change 11/16/17 2327)  dextrose 5 %-0.45 % sodium chloride infusion (not administered)  sodium chloride 0.9 % bolus 1,000 mL (0 mLs Intravenous Stopped 11/16/17 2344)  HYDROmorphone (DILAUDID) injection 1 mg (1 mg Intravenous Given 11/16/17 2000)  ondansetron (ZOFRAN) injection 4 mg (4 mg Intravenous Given 11/16/17 2000)  sodium chloride 0.9 % bolus 1,000 mL (0 mLs Intravenous Stopped 11/16/17 2344)  iopamidol (ISOVUE-300) 61 % injection (100 mLs Intravenous Contrast Given 11/16/17 2144)    Mobility walks

## 2017-11-16 NOTE — ED Triage Notes (Signed)
[facility] with complaints of abdominal pain over the last two days that got worst today, started new medication Friday  RUQ radiates over upper abdomen, tender. Increased respirations over the last two days. Pt alert and oriented x4.

## 2017-11-16 NOTE — H&P (Addendum)
TRH H&P   Patient Demographics:    Shawn Anthony, is a 52 y.o. male  MRN: [id number]   DOB - [birth year]  Admit Date - 11/16/2017  Outpatient Primary MD for the patient is [name], MD  Referring MD/NP/PA: Cathren Laine  Outpatient Specialists:   Patient coming from: home  Chief Complaint  Patient presents with  . Abdominal Pain      HPI:    Shawn Anthony  is a 52 y.o. male, w Diabetes, Hyperlipidemia who has not been feeling well for the past week and then on Friday began to have nausea and vomitting,  No bloody emesis.  Pt also noted some generalized abdominal discomfort.   Pt denies fever, chills, cough, cp, palp, sob, diarrhea, brbpr, black stool.  Pt presented to ED for evaluation   In Ed,  Wbc 24.9, hgb 17.0, Plt 385 Na 132, K 5.0 Bun 26, Creatinine 1.17 Hco3<7 Glucose 346   CT scan abd/ pelvis IMPRESSION: 1. No evidence of bowel obstruction or acute bowel inflammation. Normal appendix. 2. Stomach is mildly distended with fluid. No definite gastric wall thickening. 3. Fluid in the lower thoracic esophagus, suggesting esophageal dysmotility and/or gastroesophageal reflux. 4. Diffuse hepatic steatosis.  CXR negative  Pt denies noncompliance with medication.  Did recently start Jardiance. Pt will be admitted for DKA     Review of systems:    In addition to the HPI above, No Fever-chills, No Headache, No changes with Vision or hearing, No problems swallowing food or Liquids, No Chest pain, Cough or Shortness of Breath, No Blood in stool or Urine, No dysuria, No new skin rashes or bruises, No new joints pains-aches,  No new weakness, tingling, numbness in any extremity, No recent weight gain or loss, No polyuria, polydypsia or polyphagia, No significant Mental Stressors.  A full 10 point Review of Systems was done, except as stated above, all other  Review of Systems were negative.   With Past History of the following :    Past Medical History:  Diagnosis Date  . Diabetes mellitus   . Hyperlipidemia       Past Surgical History:  Procedure Laterality Date  . SHOULDER SURGERY        Social History:     Social History   Tobacco Use  . Smoking status: Never Smoker  . Smokeless tobacco: Never Used  Substance Use Topics  . Alcohol use: No     Lives - at home  Mobility - walks by self   Family History :     Family History  Problem Relation Age of Onset  . Diabetes Mother      Home Medications:   Prior to Admission medications   Medication Sig Start Date End Date Taking? Authorizing Provider  empagliflozin (JARDIANCE) 10 MG TABS tablet Take 10 mg by mouth daily.   Yes [provider]  glipiZIDE (  GLUCOTROL) 5 MG tablet Take 5 mg by mouth daily before breakfast.   Yes [provider]  metFORMIN (GLUCOPHAGE) 500 MG tablet Take 1,000 mg by mouth 2 (two) times daily with a meal.    Yes [provider]  Multiple Vitamin (MULTIVITAMIN WITH MINERALS) TABS tablet Take 1 tablet by mouth daily.   Yes [provider]  omeprazole (PRILOSEC) 20 MG capsule Take 20 mg by mouth daily.   Yes [provider]  OVER THE COUNTER MEDICATION Take 1 tablet by mouth every other day.   Yes [provider]  simvastatin (ZOCOR) 10 MG tablet Take 10 mg by mouth at bedtime.   Yes [provider]     Allergies:    No Known Allergies   Physical Exam:   Vitals  Blood pressure (!) 148/73, pulse (!) 121, temperature (!) 97.5 F (36.4 C), temperature source Oral, resp. rate (!) 22, SpO2 100 %.   1. General  lying in bed in NAD,    2. Normal affect and insight, Not Suicidal or Homicidal, Awake Alert, Oriented X 3.  3. No F.N deficits, ALL C.Nerves Intact, Strength 5/5 all 4 extremities, Sensation intact all 4 extremities, Plantars down going.  4. Ears and Eyes appear  Normal, Conjunctivae clear, PERRLA. Moist Oral Mucosa.  5. Supple Neck, No JVD, No cervical lymphadenopathy appriciated, No Carotid Bruits.  6. Symmetrical Chest wall movement, Good air movement bilaterally, CTAB.  7. RRR, No Gallops, Rubs or Murmurs, No Parasternal Heave.  8. Obese, Positive Bowel Sounds, Abdomen Soft, No tenderness, No organomegaly appriciated,No rebound -guarding or rigidity.  9.  No Cyanosis, Normal Skin Turgor, No Skin Rash or Bruise.  10. Good muscle tone,  joints appear normal , no effusions, Normal ROM.  11. No Palpable Lymph Nodes in Neck or Axillae     Data Review:    CBC Recent Labs  Lab 11/16/17 1959  WBC 24.9*  HGB 17.0  HCT 50.5  PLT 385  MCV 98.1  MCH 33.0  MCHC 33.7  RDW 13.1  LYMPHSABS 1.6  MONOABS 1.4*  EOSABS 0.0  BASOSABS 0.0   ------------------------------------------------------------------------------------------------------------------  Chemistries  Recent Labs  Lab 11/16/17 1959  NA 132*  K 5.0  CL 102  CO2 <7*  GLUCOSE 346*  BUN 26*  CREATININE 1.17  CALCIUM 9.0  AST 17  ALT 23  ALKPHOS 98  BILITOT 1.4*   ------------------------------------------------------------------------------------------------------------------ CrCl cannot be calculated (Unknown ideal weight.). ------------------------------------------------------------------------------------------------------------------ No results for input(s): TSH, T4TOTAL, T3FREE, THYROIDAB in the last 72 hours.  Invalid input(s): FREET3  Coagulation profile No results for input(s): INR, PROTIME in the last 168 hours. ------------------------------------------------------------------------------------------------------------------- No results for input(s): DDIMER in the last 72 hours. -------------------------------------------------------------------------------------------------------------------  Cardiac Enzymes No results for input(s): CKMB, TROPONINI,  MYOGLOBIN in the last 168 hours.  Invalid input(s): CK ------------------------------------------------------------------------------------------------------------------ No results found for: BNP   ---------------------------------------------------------------------------------------------------------------  Urinalysis    Component Value Date/Time   COLORURINE YELLOW 11/16/2017 1958   APPEARANCEUR CLEAR 11/16/2017 1958   LABSPEC 1.024 11/16/2017 1958   PHURINE 5.0 11/16/2017 1958   GLUCOSEU >=500 (A) 11/16/2017 1958   HGBUR NEGATIVE 11/16/2017 1958   BILIRUBINUR NEGATIVE 11/16/2017 1958   KETONESUR 80 (A) 11/16/2017 1958   PROTEINUR 30 (A) 11/16/2017 1958   NITRITE NEGATIVE 11/16/2017 1958   LEUKOCYTESUR NEGATIVE 11/16/2017 1958    ----------------------------------------------------------------------------------------------------------------   Imaging Results:    Ct Abdomen Pelvis W Contrast  Result Date: 11/16/2017 CLINICAL DATA:  Diffuse abdominal pain worsening for  2 days. EXAM: CT ABDOMEN AND PELVIS WITH CONTRAST TECHNIQUE: Multidetector CT imaging of the abdomen and pelvis was performed using the standard protocol following bolus administration of intravenous contrast. CONTRAST:  100 cc ISOVUE-300 IOPAMIDOL (ISOVUE-300) INJECTION 61% COMPARISON:  None. FINDINGS: Lower chest: No significant pulmonary nodules or acute consolidative airspace disease. Small amount of fluid is noted in the lower thoracic esophagus. Hepatobiliary: Diffuse hepatic steatosis. No definite liver surface irregularity. No liver mass. Normal gallbladder with no radiopaque cholelithiasis. No biliary ductal dilatation. Pancreas: Normal, with no mass or duct dilation. Spleen: Normal size. No mass. Adrenals/Urinary Tract: Normal adrenals. Normal kidneys with no hydronephrosis and no renal mass. Normal bladder. Stomach/Bowel: Stomach is mildly distended with fluid. No definite gastric wall thickening. Normal  caliber small bowel with no small bowel wall thickening. Normal appendix. Moderate stool throughout the large bowel. No large bowel wall thickening or pericolonic fat stranding. Vascular/Lymphatic: Normal caliber abdominal aorta. Patent portal, splenic, hepatic and renal veins. No pathologically enlarged lymph nodes in the abdomen or pelvis. Reproductive: Normal size prostate. Other: No pneumoperitoneum, ascites or focal fluid collection. Musculoskeletal: No aggressive appearing focal osseous lesions. Mild thoracolumbar spondylosis. IMPRESSION: 1. No evidence of bowel obstruction or acute bowel inflammation. Normal appendix. 2. Stomach is mildly distended with fluid. No definite gastric wall thickening. 3. Fluid in the lower thoracic esophagus, suggesting esophageal dysmotility and/or gastroesophageal reflux. 4. Diffuse hepatic steatosis. Electronically Signed   By: Delbert Phenix M.D.   On: 11/16/2017 22:34   ST at 120, nl axis, early R progression no st-t changes c/w ischemia   Assessment & Plan:    Active Problems:   DKA (diabetic ketoacidoses) (HCC)    DKA NPO except for medications Ns iv Insulin iv Bmp q4h   Tachycardia Tele Trop I q4h x3  N/v zofran iv q4h prn   Leukocytosis secondary to ? N/v CXR Cbc in am  DM2 STOP Jardiance STOP Metformin  STOP Glipizide  Hyperlipidemia Cont zocor  Gerd Cont PPI  DVT Prophylaxis -  Lovenox - SCDs   AM Labs Ordered, also please review Full Orders  Family Communication: Admission, patients condition and plan of care including tests being ordered have been discussed with the patient  who indicate understanding and agree with the plan and Code Status.  Code Status FULL CODE  Likely DC to  home  Condition GUARDED    Consults called: none  Admission status: inpatient   Time spent in minutes : critical care 45 minutes   Pearson Grippe M.D on 11/16/2017 at 11:17 PM  Between 7am to 7pm - Pager - [phone]. After 7pm go to  [email or web address] - password [email or web address]  [facility] - Office  [phone]

## 2017-11-16 NOTE — ED Notes (Signed)
Bed: [id number] Expected date:  Expected time:  Means of arrival:  Comments: 52 yo M/ Abd pain

## 2017-11-17 ENCOUNTER — Other Ambulatory Visit: Payer: Self-pay

## 2017-11-17 DIAGNOSIS — E0810 Diabetes mellitus due to underlying condition with ketoacidosis without coma: Secondary | ICD-10-CM

## 2017-11-17 DIAGNOSIS — E1169 Type 2 diabetes mellitus with other specified complication: Secondary | ICD-10-CM

## 2017-11-17 DIAGNOSIS — E785 Hyperlipidemia, unspecified: Secondary | ICD-10-CM

## 2017-11-17 LAB — BASIC METABOLIC PANEL
ANION GAP: 16 — AB (ref 5–15)
ANION GAP: 13 (ref 5–15)
Anion gap: 16 — ABNORMAL HIGH (ref 5–15)
Anion gap: 17 — ABNORMAL HIGH (ref 5–15)
BUN: 18 mg/dL (ref 6–20)
BUN: 20 mg/dL (ref 6–20)
BUN: 21 mg/dL — AB (ref 6–20)
BUN: 22 mg/dL — AB (ref 6–20)
BUN: 23 mg/dL — AB (ref 6–20)
CALCIUM: 8.1 mg/dL — AB (ref 8.9–10.3)
CALCIUM: 8.1 mg/dL — AB (ref 8.9–10.3)
CALCIUM: 8.1 mg/dL — AB (ref 8.9–10.3)
CALCIUM: 8.2 mg/dL — AB (ref 8.9–10.3)
CALCIUM: 8.4 mg/dL — AB (ref 8.9–10.3)
CO2: <7 mmol/L — ABNORMAL LOW (ref 22–32)
CO2: 8 mmol/L — ABNORMAL LOW (ref 22–32)
CO2: 8 mmol/L — ABNORMAL LOW (ref 22–32)
CO2: 10 mmol/L — AB (ref 22–32)
CO2: 7 mmol/L — ABNORMAL LOW (ref 22–32)
CREATININE: 0.89 mg/dL (ref 0.61–1.24)
Chloride: 104 mmol/L (ref 101–111)
Chloride: 107 mmol/L (ref 101–111)
Chloride: 106 mmol/L (ref 101–111)
Chloride: 108 mmol/L (ref 101–111)
Chloride: 108 mmol/L (ref 101–111)
Creatinine, Ser: 1.13 mg/dL (ref 0.61–1.24)
Creatinine, Ser: 1.07 mg/dL (ref 0.61–1.24)
Creatinine, Ser: 0.93 mg/dL (ref 0.61–1.24)
Creatinine, Ser: 0.9 mg/dL (ref 0.61–1.24)
GFR calc Af Amer: >60 mL/min (ref >60)
GFR calc Af Amer: >60 mL/min (ref >60)
GFR calc Af Amer: >60 mL/min (ref >60)
GFR calc Af Amer: >60 mL/min (ref >60)
GLUCOSE: 147 mg/dL — AB (ref 65–99)
GLUCOSE: 173 mg/dL — AB (ref 65–99)
Glucose, Bld: 255 mg/dL — ABNORMAL HIGH (ref 65–99)
Glucose, Bld: 157 mg/dL — ABNORMAL HIGH (ref 65–99)
Glucose, Bld: 152 mg/dL — ABNORMAL HIGH (ref 65–99)
POTASSIUM: 4 mmol/L (ref 3.5–5.1)
POTASSIUM: 4.4 mmol/L (ref 3.5–5.1)
POTASSIUM: 5.4 mmol/L — AB (ref 3.5–5.1)
Potassium: 4.9 mmol/L (ref 3.5–5.1)
Potassium: 4.9 mmol/L (ref 3.5–5.1)
SODIUM: 130 mmol/L — AB (ref 135–145)
SODIUM: 130 mmol/L — AB (ref 135–145)
SODIUM: 131 mmol/L — AB (ref 135–145)
SODIUM: 132 mmol/L — AB (ref 135–145)
Sodium: 131 mmol/L — ABNORMAL LOW (ref 135–145)

## 2017-11-17 LAB — GLUCOSE, CAPILLARY
GLUCOSE-CAPILLARY: 236 mg/dL — AB (ref 65–99)
GLUCOSE-CAPILLARY: 209 mg/dL — AB (ref 65–99)
GLUCOSE-CAPILLARY: 177 mg/dL — AB (ref 65–99)
GLUCOSE-CAPILLARY: 163 mg/dL — AB (ref 65–99)
GLUCOSE-CAPILLARY: 139 mg/dL — AB (ref 65–99)
GLUCOSE-CAPILLARY: 132 mg/dL — AB (ref 65–99)
GLUCOSE-CAPILLARY: 171 mg/dL — AB (ref 65–99)
GLUCOSE-CAPILLARY: 143 mg/dL — AB (ref 65–99)
GLUCOSE-CAPILLARY: 137 mg/dL — AB (ref 65–99)
Glucose-Capillary: 138 mg/dL — ABNORMAL HIGH (ref 65–99)
Glucose-Capillary: 143 mg/dL — ABNORMAL HIGH (ref 65–99)
Glucose-Capillary: 144 mg/dL — ABNORMAL HIGH (ref 65–99)
Glucose-Capillary: 147 mg/dL — ABNORMAL HIGH (ref 65–99)
Glucose-Capillary: 152 mg/dL — ABNORMAL HIGH (ref 65–99)
Glucose-Capillary: 158 mg/dL — ABNORMAL HIGH (ref 65–99)
Glucose-Capillary: 196 mg/dL — ABNORMAL HIGH (ref 65–99)
Glucose-Capillary: 200 mg/dL — ABNORMAL HIGH (ref 65–99)
Glucose-Capillary: 251 mg/dL — ABNORMAL HIGH (ref 65–99)

## 2017-11-17 LAB — CBC
HEMATOCRIT: 45.3 % (ref 39.0–52.0)
Hemoglobin: 15.6 g/dL (ref 13.0–17.0)
MCH: 33.2 pg (ref 26.0–34.0)
MCHC: 34.4 g/dL (ref 30.0–36.0)
MCV: 96.4 fL (ref 78.0–100.0)
PLATELETS: 306 10*3/uL (ref 150–400)
RBC: 4.7 MIL/uL (ref 4.22–5.81)
RDW: 13.1 % (ref 11.5–15.5)
WBC: 22.2 10*3/uL — ABNORMAL HIGH (ref 4.0–10.5)

## 2017-11-17 LAB — TROPONIN I
Troponin I: <0.03 ng/mL (ref <0.03)
Troponin I: <0.03 ng/mL (ref <0.03)

## 2017-11-17 LAB — MRSA PCR SCREENING: MRSA by PCR: NEGATIVE

## 2017-11-17 LAB — TSH: TSH: 1.578 u[IU]/mL (ref 0.350–4.500)

## 2017-11-17 LAB — HIV ANTIBODY (ROUTINE TESTING W REFLEX): HIV SCREEN 4TH GENERATION: NONREACTIVE

## 2017-11-17 MED ORDER — PANTOPRAZOLE SODIUM 40 MG PO TBEC
40.0000 mg | DELAYED_RELEASE_TABLET | Freq: Every day | ORAL | Status: DC
  Administered 2017-11-17 – 2017-11-23 (×7): 40 mg via ORAL
  Filled 2017-11-17 (×7): qty 1

## 2017-11-17 MED ORDER — SODIUM CHLORIDE 0.9 % IV SOLN
INTRAVENOUS | Status: AC
  Filled 2017-11-17: qty 1

## 2017-11-17 MED ORDER — IBUPROFEN 800 MG PO TABS
800.0000 mg | ORAL_TABLET | Freq: Once | ORAL | Status: AC
  Administered 2017-11-18: 800 mg via ORAL
  Filled 2017-11-17: qty 1

## 2017-11-17 MED ORDER — METOPROLOL TARTRATE 5 MG/5ML IV SOLN
5.0000 mg | Freq: Four times a day (QID) | INTRAVENOUS | Status: DC | PRN
  Administered 2017-11-17: 5 mg via INTRAVENOUS
  Filled 2017-11-17: qty 5

## 2017-11-17 MED ORDER — SODIUM CHLORIDE 0.9 % IV SOLN: INTRAVENOUS | Status: DC

## 2017-11-17 MED ORDER — POTASSIUM CHLORIDE 10 MEQ/100ML IV SOLN: 10.0000 meq | INTRAVENOUS | Status: DC

## 2017-11-17 MED ORDER — SODIUM CHLORIDE 0.9 % IV SOLN
INTRAVENOUS | Status: DC
  Administered 2017-11-17: 20:00:00 via INTRAVENOUS

## 2017-11-17 MED ORDER — ONDANSETRON HCL 4 MG/2ML IJ SOLN
4.0000 mg | Freq: Four times a day (QID) | INTRAMUSCULAR | Status: DC | PRN
  Administered 2017-11-17: 4 mg via INTRAVENOUS
  Filled 2017-11-17: qty 2

## 2017-11-17 MED ORDER — SODIUM CHLORIDE 0.9 % IV SOLN
INTRAVENOUS | Status: AC
  Administered 2017-11-17: 01:00:00 via INTRAVENOUS
  Filled 2017-11-17: qty 1

## 2017-11-17 MED ORDER — INSULIN GLARGINE 100 UNIT/ML ~~LOC~~ SOLN
15.0000 [IU] | Freq: Two times a day (BID) | SUBCUTANEOUS | Status: DC
  Administered 2017-11-17 (×2): 15 [IU] via SUBCUTANEOUS
  Filled 2017-11-17 (×2): qty 0.15

## 2017-11-17 MED ORDER — SODIUM CHLORIDE 0.9 % IV SOLN
INTRAVENOUS | Status: AC
  Administered 2017-11-17: 1000 mL via INTRAVENOUS

## 2017-11-17 MED ORDER — HYOSCYAMINE SULFATE 0.5 MG/ML IJ SOLN
0.5000 mg | Freq: Four times a day (QID) | INTRAMUSCULAR | Status: DC | PRN
  Filled 2017-11-17: qty 1

## 2017-11-17 MED ORDER — SODIUM CHLORIDE 0.9 % IV BOLUS (SEPSIS)
500.0000 mL | Freq: Once | INTRAVENOUS | Status: AC
  Administered 2017-11-18: 500 mL via INTRAVENOUS

## 2017-11-17 MED ORDER — INSULIN ASPART 100 UNIT/ML ~~LOC~~ SOLN
0.0000 [IU] | Freq: Three times a day (TID) | SUBCUTANEOUS | Status: DC
  Administered 2017-11-17: 1 [IU] via SUBCUTANEOUS

## 2017-11-17 MED ORDER — SIMVASTATIN 10 MG PO TABS
10.0000 mg | ORAL_TABLET | Freq: Every day | ORAL | Status: DC
  Administered 2017-11-17 – 2017-11-22 (×4): 10 mg via ORAL
  Filled 2017-11-17 (×4): qty 1

## 2017-11-17 MED ORDER — ENOXAPARIN SODIUM 40 MG/0.4ML ~~LOC~~ SOLN
40.0000 mg | Freq: Every day | SUBCUTANEOUS | Status: DC
  Administered 2017-11-17 – 2017-11-23 (×7): 40 mg via SUBCUTANEOUS
  Filled 2017-11-17 (×7): qty 0.4

## 2017-11-17 MED ORDER — DEXTROSE-NACL 5-0.45 % IV SOLN
INTRAVENOUS | Status: DC
  Administered 2017-11-17: 01:00:00 via INTRAVENOUS

## 2017-11-17 MED ORDER — ALUM & MAG HYDROXIDE-SIMETH 200-200-20 MG/5ML PO SUSP
30.0000 mL | ORAL | Status: DC | PRN
  Administered 2017-11-17: 30 mL via ORAL
  Filled 2017-11-17: qty 30

## 2017-11-17 MED ORDER — INSULIN ASPART 100 UNIT/ML ~~LOC~~ SOLN
3.0000 [IU] | Freq: Once | SUBCUTANEOUS | Status: AC
  Administered 2017-11-17: 3 [IU] via SUBCUTANEOUS

## 2017-11-17 MED ORDER — INSULIN GLARGINE 100 UNIT/ML ~~LOC~~ SOLN
15.0000 [IU] | Freq: Two times a day (BID) | SUBCUTANEOUS | Status: DC
  Filled 2017-11-17: qty 0.15

## 2017-11-17 MED ORDER — DEXTROSE-NACL 5-0.45 % IV SOLN
INTRAVENOUS | Status: DC
  Administered 2017-11-17: 15:00:00 via INTRAVENOUS

## 2017-11-17 NOTE — Progress Notes (Signed)
Inpatient Diabetes Program Recommendations  AACE/ADA: New Consensus Statement on Inpatient Glycemic Control (2015)  Target Ranges:  Prepandial:   less than 140 mg/dL      Peak postprandial:   less than 180 mg/dL (1-2 hours)      Critically ill patients:  140 - 180 mg/dL   Lab Results  Component Value Date   GLUCAP 152 (H) 11/17/2017    Review of Glycemic ControlResults for Shawn Anthony, Shawn Anthony (MRN [id number]) as of 11/17/2017 10:32  Ref. Range 11/17/2017 06:39 11/17/2017 07:20 11/17/2017 07:55 11/17/2017 09:08 11/17/2017 10:15  Glucose-Capillary Latest Ref Range: 65 - 99 mg/dL 409138 (H) 811139 (H) 914132 (H) 147 (H) 152 (H)   Inpatient Diabetes Program Recommendations:    Note that CO2 still low and AG still elevated.  Discussed with MD.  Recommend continuing insulin drip until acidosis cleared.   Thanks,  Beryl MeagerJenny Jennilee Demarco, RN, BC-ADM Inpatient Diabetes Coordinator Pager [phone] (8a-5p)

## 2017-11-17 NOTE — Progress Notes (Signed)
Inpatient Diabetes Program Recommendations  AACE/ADA: New Consensus Statement on Inpatient Glycemic Control (2015)  Target Ranges:  Prepandial:   less than 140 mg/dL      Peak postprandial:   less than 180 mg/dL (1-2 hours)      Critically ill patients:  140 - 180 mg/dL   Lab Results  Component Value Date   GLUCAP 132 (H) 11/17/2017   Results for Lenoir, Shawn Anthony (MRN [id number]) as of 11/17/2017 08:11  Ref. Range 11/17/2017 05:17  BASIC METABOLIC PANEL Unknown Rpt (A)  Sodium Latest Ref Range: 135 - 145 mmol/L 131 (L)  Potassium Latest Ref Range: 3.5 - 5.1 mmol/L 4.9  Chloride Latest Ref Range: 101 - 111 mmol/L 107  CO2 Latest Ref Range: 22 - 32 mmol/L 8 (L)  Glucose Latest Ref Range: 65 - 99 mg/dL 409157 (H)  BUN Latest Ref Range: 6 - 20 mg/dL 22 (H)  Creatinine Latest Ref Range: 0.61 - 1.24 mg/dL 8.111.07  Calcium Latest Ref Range: 8.9 - 10.3 mg/dL 8.1 (L)  Anion gap Latest Ref Range: 5 - 15  16 (H)  GFR, Est Non African American Latest Ref Range: >60 mL/min >60  GFR, Est African American Latest Ref Range: >60 mL/min >60   Review of Glycemic Control- Still on insulin drip  Diabetes history: Type 2 DM Outpatient Diabetes medications:  Jardiance 10 mg daily, Glipizide 5 mg daily, Metformin 1000 mg bid Current orders for Inpatient glycemic control:  IV insulin/DKA order set  Inpatient Diabetes Program Recommendations:    Note that patient admitted with DKA however blood sugar only 346 on initial lab. Patient was listed to be on Jardiance prior to admit which can increase risk for DKA.  Please consider d/c of this medication at d/c.  CO2 still low and AG still elevated.  Patient will need to continue on insulin drip until acidosis cleared.   Will speak with patient today regarding DM.   Thanks  Beryl MeagerJenny Mccall Will, RN, BC-ADM Inpatient Diabetes Coordinator Pager [phone] (8a-5p)

## 2017-11-17 NOTE — Progress Notes (Addendum)
Pt began complaining of "all over body aching". RN performed EKG to rule out cardiac problem as probable cause, due to patient having indigestion and later an episode of nausea and vomiting. EKG documented in chart. RN placed patient on droplet precaution and notified NP that pt is having body aches and is afebrile. RN also notified NP that patient is tachyneic and has increased WOB with activity. Pt currently has an oxygen saturation of 98-100% on RA. Awaiting orders to determine if patient needs to be tested for influenza. 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 Will continue to monitor.

## 2017-11-17 NOTE — Care Management Note (Signed)
Case Management Note  Patient Details  Name: Shawn Anthony MRN: [id number] Date of Birth: 12/08/1965  Subjective/Objective:                  abd pain and dka on iv insulin.  Action/Plan: Date:  November 17, 2017 Chart reviewed for concurrent status and case management needs.  Will continue to follow patient progress.  Discharge Planning: following for needs.  None present at this time of review. Expected discharge date: January 172019 Marcelle SmilingRhonda Jylian Pappalardo, BSN, PrimeraRN3, [place]   [phone]   Expected Discharge Date:                  Expected Discharge Plan:  Home/Self Care  In-House Referral:     Discharge planning Services  CM Consult  Post Acute Care Choice:    Choice offered to:     DME Arranged:    DME Agency:     HH Arranged:    HH Agency:     Status of Service:  In process, will continue to follow  If discussed at Long Length of Stay Meetings, dates discussed:    Additional Comments:  Golda AcreDavis, Letasha Kershaw Lynn, RN 11/17/2017, 9:38 AM

## 2017-11-17 NOTE — Progress Notes (Signed)
2200 CBG 196. No SSI ordered for bedtime, just Lantus. RN notified NP regarding current CBG and asked if SSI should be given at this time in addition to Lantus. Will continue to monitor.

## 2017-11-17 NOTE — Progress Notes (Addendum)
Merdis DelayK. Schorr, NP made aware of BMET results from 1753 lab draw. NP made aware that no additional BMETs ordered at this time. Will continue to monitor.

## 2017-11-17 NOTE — Progress Notes (Signed)
Inpatient Diabetes Program Recommendations  AACE/ADA: New Consensus Statement on Inpatient Glycemic Control (2015)  Target Ranges:  Prepandial:   less than 140 mg/dL      Peak postprandial:   less than 180 mg/dL (1-2 hours)      Critically ill patients:  140 - 180 mg/dL   Lab Results  Component Value Date   GLUCAP 171 (H) 11/17/2017    Spoke with patient regarding DKA admit.   Patient had lost his insurance and it was recently reinstated.  Patient was on Invokana in the past along with Metformin and glipizide.  However he ran out of Invokana.  He recently got insurance again and was switched to [place]Jardiance.  Patient started Jardiance last week and began to get sick after restarting.  He states that he is not sure if he got a virus or if he got sick due to the [place]ardiance.  We discussed DKA and potential for DKA with SGLT-2 medications.   Will follow.  Would not resume Jardiance at d/c and patient may need basal insulin at d/c.   Discussed with bedside RN. BMET pending.   Thanks, Beryl MeagerJenny Martine Trageser, RN, BC-ADM Inpatient Diabetes Coordinator Pager [phone] (8a-5p)

## 2017-11-17 NOTE — Progress Notes (Signed)
PROGRESS NOTE    Shawn Anthony  ZOX:[id number] DOB: 03/04/1966 DOA: 11/16/2017 PCP: [name], [name], MD    Brief Narrative:  52 year old male who presented with abdominal pain. He does have a significant past medical history type 2 diabetes mellitus, and dyslipidemia. Patient not feeling well for last 7 days, developed nausea and vomiting for last 48 hours prior to hospitalization, associated with abdominal discomfort. Patient had a recent change in his antihyperglycemic regimen due to insurance coverage. Has not been on insulin therapy before. On initial physical examination blood pressure 148/73, heart rate 121, temperature is 97.5, respiratory 22, oxygen saturation 100%. Lungs were clear to auscultation bilaterally, heart S1-S2 present, rhythmic, tachycardic, the abdomen was protuberant, soft, nontender, no lower extremity edema. Sodium 132, potassium 5.0, chloride 102, bicarbonate less than 7, glucose 346, BUN 26, creatinine 1.17, unable to calculate anion gap, white count 24.9, hemoglobin 17.0, hematocrit 50.5, platelets 385, urinalysis greater than 500 glucose, specific gravity 1.024, white cells 0-5. CT of the abdomen with no acute changes. Chest x-Bellino with mild right hemidiaphragm elevation, no infiltrates. EKG sinus tachycardia 120 bpm with normal axis and normal intervals. Patient was admitted to the stepdown unit with working diagnosis of diabetes ketoacidosis.  Assessment & Plan:   Principal Problem:   DKA (diabetic ketoacidoses) (HCC) Active Problems:   Leukocytosis   Tachycardia   1. Diabetes ketoacidosis. Gap continue to close, patient with improved abdominal pain, with no nausea or vomiting. Patient with high insulin requirements since admission. Over last 6 hours about 20 units, that will add up to 80 units per day, 70% will be 56 units, will use 50% of that about 30 units per day, will order 15 units bid and continue insulin sliding scale for glucose cover and monitoring. Will  advance diet as tolerated. Hold on oral hypoglycemic agents.   2. Leukocytosis. Likely reactive, no signs of systemic infection, will continue supportive medical care, follow cell count in am, continue to hold on antibiotic therapy.   3. Uncontrolled type 2 diabetes mellitus. Recent change in outpatient regimen, will continue insulin therapy for now, likely will need insulin at discharge.   4. Dyslipidemia. Continue statin therapy.   5. GERD. Continue antiacid therapy, will advance diet as tolerated.    DVT prophylaxis: enoxaparin  Code Status: full Family Communication: no family at the bedside Disposition Plan: home   Consultants:     Procedures:     Antimicrobials:       Subjective: Patient feeling deconditioned and tired, nausea and abdominal pain have improved no dyspnea or chest pain.   Objective: Vitals:   11/17/17 0400 11/17/17 0600 11/17/17 0700 11/17/17 0800  BP: 113/76 (!) 149/63 (!) 144/70   Pulse: (!) 103 (!) 137 95   Resp: (!) 27 19 18    Temp:    98.5 F (36.9 C)  TempSrc:    Axillary  SpO2: 100% 100% 100%   Weight:      Height:        Intake/Output Summary (Last 24 hours) at 11/17/2017 0846 Last data filed at 11/17/2017 0600 Gross per 24 hour  Intake 4816 ml  Output 1200 ml  Net 3616 ml   Filed Weights   11/17/17 0001 11/17/17 0030  Weight: 99.8 kg (220 lb) (!) 138.4 kg (305 lb 1.9 oz)    Examination:   General: deconditioned and ill looking appearing Neurology: Awake and alert E ENT: mild pallor, no icterus, oral mucosa moist Cardiovascular: No JVD. S1-S2 present, rhythmic, no gallops,  rubs, or murmurs. No lower extremity edema. Pulmonary: decreased breath sounds bilaterally at bases, adequate air movement, no wheezing, rhonchi or rales. Gastrointestinal. Abdomen protuberant, no organomegaly, non tender, no rebound or guarding Skin. No rashes Musculoskeletal: no joint deformities     Data Reviewed: I have personally reviewed  following labs and imaging studies  CBC: Recent Labs  Lab 11/16/17 1959 11/17/17 0517  WBC 24.9* 22.2*  NEUTROABS 21.8*  --   HGB 17.0 15.6  HCT 50.5 45.3  MCV 98.1 96.4  PLT 385 306   Basic Metabolic Panel: Recent Labs  Lab 11/16/17 1959 11/16/17 2325 11/17/17 0024 11/17/17 0517  NA 132* 129* 130* 131*  K 5.0 5.1 5.4* 4.9  CL 102 103 104 107  CO2 <7* 7* <7* 8*  GLUCOSE 346* 263* 255* 157*  BUN 26* 24* 23* 22*  CREATININE 1.17 0.99 1.13 1.07  CALCIUM 9.0 7.9* 8.4* 8.1*   GFR: Estimated Creatinine Clearance: 116.7 mL/min (by C-G formula based on SCr of 1.07 mg/dL). Liver Function Tests: Recent Labs  Lab 11/16/17 1959  AST 17  ALT 23  ALKPHOS 98  BILITOT 1.4*  PROT 9.1*  ALBUMIN 4.3   Recent Labs  Lab 11/16/17 1959  LIPASE 20   No results for input(s): AMMONIA in the last 168 hours. Coagulation Profile: No results for input(s): INR, PROTIME in the last 168 hours. Cardiac Enzymes: Recent Labs  Lab 11/17/17 0024 11/17/17 0517  TROPONINI <0.03 <0.03   BNP (last 3 results) No results for input(s): PROBNP in the last 8760 hours. HbA1C: No results for input(s): HGBA1C in the last 72 hours. CBG: Recent Labs  Lab 11/17/17 0446 11/17/17 0550 11/17/17 0639 11/17/17 0720 11/17/17 0755  GLUCAP 163* 200* 138* 139* 132*   Lipid Profile: No results for input(s): CHOL, HDL, LDLCALC, TRIG, CHOLHDL, LDLDIRECT in the last 72 hours. Thyroid Function Tests: Recent Labs    11/17/17 0510  TSH 1.578   Anemia Panel: No results for input(s): VITAMINB12, FOLATE, FERRITIN, TIBC, IRON, RETICCTPCT in the last 72 hours.    Radiology Studies: I have reviewed all of the imaging during this hospital visit personally     Scheduled Meds: . enoxaparin (LOVENOX) injection  40 mg Subcutaneous Daily  . pantoprazole  40 mg Oral Daily  . simvastatin  10 mg Oral QHS   Continuous Infusions: . sodium chloride    . dextrose 5 % and 0.45% NaCl 75 mL/hr at 11/17/17  0600  . insulin (NOVOLIN-R) infusion 1.4 Units/hr (11/17/17 0757)     LOS: 1 day        Verania Salberg Annett Gula, MD [facility] Pager [phone]

## 2017-11-18 LAB — BASIC METABOLIC PANEL
BUN: 20 mg/dL (ref 6–20)
BUN: 21 mg/dL — ABNORMAL HIGH (ref 6–20)
BUN: 21 mg/dL — ABNORMAL HIGH (ref 6–20)
BUN: 22 mg/dL — ABNORMAL HIGH (ref 6–20)
BUN: 23 mg/dL — ABNORMAL HIGH (ref 6–20)
CALCIUM: 7.5 mg/dL — AB (ref 8.9–10.3)
CO2: <7 mmol/L — ABNORMAL LOW (ref 22–32)
CO2: <7 mmol/L — ABNORMAL LOW (ref 22–32)
CO2: <7 mmol/L — ABNORMAL LOW (ref 22–32)
CO2: <7 mmol/L — ABNORMAL LOW (ref 22–32)
CREATININE: 0.91 mg/dL (ref 0.61–1.24)
Calcium: 7.8 mg/dL — ABNORMAL LOW (ref 8.9–10.3)
Calcium: 7.8 mg/dL — ABNORMAL LOW (ref 8.9–10.3)
Calcium: 7.8 mg/dL — ABNORMAL LOW (ref 8.9–10.3)
Calcium: 7.7 mg/dL — ABNORMAL LOW (ref 8.9–10.3)
Chloride: 108 mmol/L (ref 101–111)
Chloride: 107 mmol/L (ref 101–111)
Chloride: 111 mmol/L (ref 101–111)
Chloride: 116 mmol/L — ABNORMAL HIGH (ref 101–111)
Chloride: 112 mmol/L — ABNORMAL HIGH (ref 101–111)
Creatinine, Ser: 0.98 mg/dL (ref 0.61–1.24)
Creatinine, Ser: 0.99 mg/dL (ref 0.61–1.24)
Creatinine, Ser: 0.89 mg/dL (ref 0.61–1.24)
Creatinine, Ser: 0.93 mg/dL (ref 0.61–1.24)
GFR calc Af Amer: >60 mL/min (ref >60)
GFR calc Af Amer: >60 mL/min (ref >60)
GFR calc Af Amer: >60 mL/min (ref >60)
GFR calc Af Amer: >60 mL/min (ref >60)
GFR calc non Af Amer: >60 mL/min (ref >60)
GFR calc non Af Amer: >60 mL/min (ref >60)
GFR calc non Af Amer: >60 mL/min (ref >60)
GFR calc non Af Amer: >60 mL/min (ref >60)
Glucose, Bld: 207 mg/dL — ABNORMAL HIGH (ref 65–99)
Glucose, Bld: 216 mg/dL — ABNORMAL HIGH (ref 65–99)
Glucose, Bld: 162 mg/dL — ABNORMAL HIGH (ref 65–99)
Glucose, Bld: 126 mg/dL — ABNORMAL HIGH (ref 65–99)
Glucose, Bld: 180 mg/dL — ABNORMAL HIGH (ref 65–99)
Potassium: 4.4 mmol/L (ref 3.5–5.1)
Potassium: 4.7 mmol/L (ref 3.5–5.1)
Potassium: 4 mmol/L (ref 3.5–5.1)
Potassium: 4.7 mmol/L (ref 3.5–5.1)
Potassium: 4.3 mmol/L (ref 3.5–5.1)
SODIUM: 131 mmol/L — AB (ref 135–145)
Sodium: 131 mmol/L — ABNORMAL LOW (ref 135–145)
Sodium: 131 mmol/L — ABNORMAL LOW (ref 135–145)
Sodium: 133 mmol/L — ABNORMAL LOW (ref 135–145)
Sodium: 132 mmol/L — ABNORMAL LOW (ref 135–145)

## 2017-11-18 LAB — CBC WITH DIFFERENTIAL/PLATELET
BASOS PCT: 0 %
Basophils Absolute: 0 10*3/uL (ref 0.0–0.1)
Eosinophils Absolute: 0 10*3/uL (ref 0.0–0.7)
Eosinophils Relative: 0 %
HEMATOCRIT: 45.6 % (ref 39.0–52.0)
Hemoglobin: 15.4 g/dL (ref 13.0–17.0)
Lymphocytes Relative: 10 %
Lymphs Abs: 1.7 10*3/uL (ref 0.7–4.0)
MCH: 33 pg (ref 26.0–34.0)
MCHC: 33.8 g/dL (ref 30.0–36.0)
MCV: 97.6 fL (ref 78.0–100.0)
Monocytes Absolute: 0.9 10*3/uL (ref 0.1–1.0)
Monocytes Relative: 5 %
NEUTROS ABS: 14.9 10*3/uL — AB (ref 1.7–7.7)
NEUTROS PCT: 85 %
Platelets: 308 10*3/uL (ref 150–400)
RBC: 4.67 MIL/uL (ref 4.22–5.81)
RDW: 13.9 % (ref 11.5–15.5)
WBC: 17.5 10*3/uL — AB (ref 4.0–10.5)

## 2017-11-18 LAB — BLOOD GAS, ARTERIAL
DRAWN BY: 441261
DRAWN BY: 11249
FIO2: 21
FIO2: 21
O2 Saturation: 97.9 %
O2 Saturation: 97.7 %
PATIENT TEMPERATURE: 98.6
PO2 ART: 117 mmHg — AB (ref 83.0–108.0)
Patient temperature: 36
pH, Arterial: 6.99 — CL (ref 7.350–7.450)
pH, Arterial: 7.057 — CL (ref 7.350–7.450)
pO2, Arterial: 128 mmHg — ABNORMAL HIGH (ref 83.0–108.0)

## 2017-11-18 LAB — GLUCOSE, CAPILLARY
GLUCOSE-CAPILLARY: 163 mg/dL — AB (ref 65–99)
GLUCOSE-CAPILLARY: 92 mg/dL (ref 65–99)
GLUCOSE-CAPILLARY: 121 mg/dL — AB (ref 65–99)
GLUCOSE-CAPILLARY: 121 mg/dL — AB (ref 65–99)
GLUCOSE-CAPILLARY: 143 mg/dL — AB (ref 65–99)
GLUCOSE-CAPILLARY: 227 mg/dL — AB (ref 65–99)
Glucose-Capillary: 207 mg/dL — ABNORMAL HIGH (ref 65–99)
Glucose-Capillary: 207 mg/dL — ABNORMAL HIGH (ref 65–99)
Glucose-Capillary: 190 mg/dL — ABNORMAL HIGH (ref 65–99)
Glucose-Capillary: 115 mg/dL — ABNORMAL HIGH (ref 65–99)
Glucose-Capillary: 176 mg/dL — ABNORMAL HIGH (ref 65–99)
Glucose-Capillary: 192 mg/dL — ABNORMAL HIGH (ref 65–99)
Glucose-Capillary: 167 mg/dL — ABNORMAL HIGH (ref 65–99)
Glucose-Capillary: 178 mg/dL — ABNORMAL HIGH (ref 65–99)

## 2017-11-18 MED ORDER — SODIUM BICARBONATE 8.4 % IV SOLN
INTRAVENOUS | Status: DC
  Administered 2017-11-18 – 2017-11-19 (×2): via INTRAVENOUS
  Filled 2017-11-18 (×2): qty 150

## 2017-11-18 MED ORDER — DEXTROSE-NACL 5-0.45 % IV SOLN
INTRAVENOUS | Status: DC
  Administered 2017-11-18: 09:00:00 via INTRAVENOUS

## 2017-11-18 MED ORDER — SODIUM BICARBONATE 8.4 % IV SOLN
50.0000 meq | Freq: Once | INTRAVENOUS | Status: AC
  Administered 2017-11-18: 50 meq via INTRAVENOUS
  Filled 2017-11-18: qty 50

## 2017-11-18 MED ORDER — KCL IN DEXTROSE-NACL 10-5-0.45 MEQ/L-%-% IV SOLN
INTRAVENOUS | Status: DC
Start: 2017-11-18 — End: 2017-11-18
  Administered 2017-11-18: 14:00:00 via INTRAVENOUS
  Filled 2017-11-18: qty 1000

## 2017-11-18 MED ORDER — SODIUM CHLORIDE 0.45 % IV SOLN: INTRAVENOUS | Status: DC

## 2017-11-18 MED ORDER — LIVING WELL WITH DIABETES BOOK
Freq: Once | Status: AC
  Administered 2017-11-18: 13:00:00
  Filled 2017-11-18: qty 1

## 2017-11-18 MED ORDER — POTASSIUM CHLORIDE 10 MEQ/100ML IV SOLN
10.0000 meq | INTRAVENOUS | Status: AC
  Administered 2017-11-18 (×2): 10 meq via INTRAVENOUS
  Filled 2017-11-18 (×2): qty 100

## 2017-11-18 MED ORDER — SODIUM CHLORIDE 0.9 % IV SOLN
INTRAVENOUS | Status: DC
  Administered 2017-11-18: 1.6 [IU]/h via INTRAVENOUS
  Administered 2017-11-19: 4.8 [IU]/h via INTRAVENOUS
  Administered 2017-11-20: 15:00:00 via INTRAVENOUS
  Administered 2017-11-21: 7.2 [IU]/h via INTRAVENOUS
  Filled 2017-11-18 (×5): qty 1

## 2017-11-18 MED ORDER — SODIUM CHLORIDE 0.9 % IV SOLN
INTRAVENOUS | Status: DC
  Administered 2017-11-18: 1000 mL via INTRAVENOUS

## 2017-11-18 MED ORDER — SODIUM CHLORIDE 0.9 % IV SOLN: INTRAVENOUS | Status: DC

## 2017-11-18 NOTE — Progress Notes (Signed)
Sent NP message with ABG results  Awaiting orders.  Abg cont not to register CO2 or HCO3 level  Too low to report

## 2017-11-18 NOTE — Progress Notes (Signed)
Pt is red in the face, RR remains tachypneic, oxygen saturation 98-100% on RA, lungs clear. RN notified NP regarding patient presentation and that patient maybe Kussmal breathing. Order received for BMET to be drawn, NP felt ABG was not necessary at this time. Will continue to monitor.

## 2017-11-18 NOTE — Progress Notes (Signed)
NP made aware of results for 0319 BMET. Will continue to monitor.

## 2017-11-18 NOTE — Progress Notes (Signed)
Inpatient Diabetes Program Recommendations  AACE/ADA: New Consensus Statement on Inpatient Glycemic Control (2015)  Target Ranges:  Prepandial:   less than 140 mg/dL      Peak postprandial:   less than 180 mg/dL (1-2 hours)      Critically ill patients:  140 - 180 mg/dL   Lab Results  Component Value Date   GLUCAP 196 (H) 11/17/2017    Review of Glycemic Control Results for Shawn Anthony, Shawn Anthony (MRN [id number]) as of 11/18/2017 09:01  Ref. Range 11/18/2017 07:23  BASIC METABOLIC PANEL Unknown Rpt (A)  Sodium Latest Ref Range: 135 - 145 mmol/L 131 (L)  Potassium Latest Ref Range: 3.5 - 5.1 mmol/L 4.7  Chloride Latest Ref Range: 101 - 111 mmol/L 107  CO2 Latest Ref Range: 22 - 32 mmol/L <7 (L)  Glucose Latest Ref Range: 65 - 99 mg/dL 409216 (H)  BUN Latest Ref Range: 6 - 20 mg/dL 21 (H)  Creatinine Latest Ref Range: 0.61 - 1.24 mg/dL 8.110.99  Calcium Latest Ref Range: 8.9 - 10.3 mg/dL 7.8 (L)  Anion gap Latest Ref Range: 5 - 15  NOT CALCULATED  GFR, Est Non African American Latest Ref Range: >60 mL/min >60  GFR, Est African American Latest Ref Range: >60 mL/min >60  Diabetes history: Type 2 DM Outpatient Diabetes medications:  Jardiance 10 mg daily, Glipizide 5 mg daily, Metformin 1000 mg bid Current orders for Inpatient glycemic control:  IV insulin/DKA order set  Inpatient Diabetes Program Recommendations:    AG reopened yesterday and CO2 low.  IV insulin resumed this morning.  Discussed with MD and RN.  Please continue insulin drip until CO2=19 and AG closed.  May take longer to reverse acidosis with SGL-T2?  Will follow.   Thanks,  Beryl MeagerJenny Trisha Ken, RN, BC-ADM Inpatient Diabetes Coordinator Pager [phone] (8a-5p)

## 2017-11-18 NOTE — Progress Notes (Signed)
PROGRESS NOTE    Shawn Anthony  ZOX:[id number] DOB: 12-Nov-1965 DOA: 11/16/2017 PCP: [name], MD    Brief Narrative:  52 year old male who presented with abdominal pain. He does have a significant past medical history type 2 diabetes mellitus, and dyslipidemia. Patient not feeling well for last 7 days, developed nausea and vomiting for last 48 hours prior to hospitalization, associated with abdominal discomfort. Patient had a recent change in his antihyperglycemic regimen due to insurance coverage. Has not been on insulin therapy before. On initial physical examination blood pressure 148/73, heart rate 121, temperature is 97.5, respiratory 22, oxygen saturation 100%. Lungs were clear to auscultation bilaterally, heart S1-S2 present, rhythmic, tachycardic, the abdomen was protuberant, soft, nontender, no lower extremity edema. Sodium 132, potassium 5.0, chloride 102, bicarbonate less than 7, glucose 346, BUN 26, creatinine 1.17, unable to calculate anion gap, white count 24.9, hemoglobin 17.0, hematocrit 50.5, platelets 385, urinalysis greater than 500 glucose, specific gravity 1.024, white cells 0-5. CT of the abdomen with no acute changes. Chest x-Dubreuil with mild right hemidiaphragm elevation, no infiltrates. EKG sinus tachycardia 120 bpm with normal axis and normal intervals.  Patient was admitted to the stepdown unit with working diagnosis of diabetes ketoacidosis  Assessment & Plan:   Principal Problem:   DKA (diabetic ketoacidoses) (HCC) Active Problems:   Leukocytosis   Tachycardia   1. Diabetes ketoacidosis. Patient had rebound ketosis with anion gap at least up to 17, noted normoglycemic, likely a consequence of empagliflozin. Will resume insulin drip until complete resolution of ketosis, anion gap less than 12. Dextrose with half normal saline infusion to prevent hypoglycemia. Follow renal panel this pm. All advance diet as tolerated with clear liquids. Serum K at 4.0. Will add kcl to  IV fluids.   2. Leukocytosis reactive. Wbc down to 17 from 22, no signs of infection, will continue to monitor cell count.   3. Uncontrolled type 2 diabetes mellitus. Suspected empagliflozin triggered ketosis, currently normoglycemic, suspected due to glucosuria associate with Na-Glucose cotransporter inhibition at the proximal tubule. Will continue IV insulin therapy for now.   4. Dyslipidemia. Tolerating well statin therapy.   5. GERD. Antiacid therapy, with pantoprazole.    DVT prophylaxis: enoxaparin  Code Status: full Family Communication: no family at the bedside Disposition Plan: home   Consultants:     Procedures:     Antimicrobials:       Subjective: Patient not feeling well this am, positive flushing, and tachypnea, mild nausea with dyspnea, no diarrhea or vomiting.   Objective: Vitals:   11/18/17 0500 11/18/17 0600 11/18/17 0732 11/18/17 0800  BP:   (!) 118/44   Pulse: (!) 110 (!) 118 (!) 33   Resp: (!) 34 (!) 38 (!) 32   Temp:    (!) 95 F (35 C)  TempSrc:    Axillary  SpO2: 100% 100% 97%   Weight:      Height:        Intake/Output Summary (Last 24 hours) at 11/18/2017 0819 Last data filed at 11/18/2017 0715 Gross per 24 hour  Intake 2547.56 ml  Output 3225 ml  Net -677.44 ml   Filed Weights   11/17/17 0001 11/17/17 0030  Weight: 99.8 kg (220 lb) (!) 138.4 kg (305 lb 1.9 oz)    Examination:   General: deconditioned and ill looking appearing/ positive flushing Neurology: somnolent, non focal  E ENT: mild pallor, no icterus, oral mucosa moist Cardiovascular: No JVD. S1-S2 present, rhythmic, no gallops, rubs, or murmurs.  Trace lower extremity edema. Pulmonary: vesicular breath sounds bilaterally, adequate air movement, no wheezing, rhonchi or rales.  Gastrointestinal. Abdomen protuberant, no organomegaly, non tender, no rebound or guarding Skin. No rashes Musculoskeletal: no joint deformities     Data Reviewed: I have  personally reviewed following labs and imaging studies  CBC: Recent Labs  Lab 11/16/17 1959 11/17/17 0517 11/18/17 0319  WBC 24.9* 22.2* 17.5*  NEUTROABS 21.8*  --  14.9*  HGB 17.0 15.6 15.4  HCT 50.5 45.3 45.6  MCV 98.1 96.4 97.6  PLT 385 306 308   Basic Metabolic Panel: Recent Labs  Lab 11/17/17 0903 11/17/17 1143 11/17/17 1753 11/18/17 0319 11/18/17 0723  NA 130* 131* 132* 131* 131*  K 4.9 4.4 4.0 4.4 4.7  CL 106 108 108 108 107  CO2 8* 10* 7* <7* <7*  GLUCOSE 147* 152* 173* 207* 216*  BUN 21* 20 18 20  21*  CREATININE 0.93 0.90 0.89 0.98 0.99  CALCIUM 8.1* 8.1* 8.2* 7.8* 7.8*   GFR: Estimated Creatinine Clearance: 126.1 mL/min (by C-G formula based on SCr of 0.99 mg/dL). Liver Function Tests: Recent Labs  Lab 11/16/17 1959  AST 17  ALT 23  ALKPHOS 98  BILITOT 1.4*  PROT 9.1*  ALBUMIN 4.3   Recent Labs  Lab 11/16/17 1959  LIPASE 20   No results for input(s): AMMONIA in the last 168 hours. Coagulation Profile: No results for input(s): INR, PROTIME in the last 168 hours. Cardiac Enzymes: Recent Labs  Lab 11/17/17 0024 11/17/17 0517 11/17/17 0903  TROPONINI <0.03 <0.03 <0.03   BNP (last 3 results) No results for input(s): PROBNP in the last 8760 hours. HbA1C: No results for input(s): HGBA1C in the last 72 hours. CBG: Recent Labs  Lab 11/17/17 1404 11/17/17 1507 11/17/17 1613 11/17/17 1744 11/17/17 2134  GLUCAP 143* 143* 137* 144* 196*   Lipid Profile: No results for input(s): CHOL, HDL, LDLCALC, TRIG, CHOLHDL, LDLDIRECT in the last 72 hours. Thyroid Function Tests: Recent Labs    11/17/17 0510  TSH 1.578   Anemia Panel: No results for input(s): VITAMINB12, FOLATE, FERRITIN, TIBC, IRON, RETICCTPCT in the last 72 hours.    Radiology Studies: I have reviewed all of the imaging during this hospital visit personally     Scheduled Meds: . enoxaparin (LOVENOX) injection  40 mg Subcutaneous Daily  . pantoprazole  40 mg Oral Daily    . simvastatin  10 mg Oral QHS   Continuous Infusions: . sodium chloride 1,000 mL (11/18/17 0715)  . sodium chloride    . dextrose 5 % and 0.45% NaCl    . insulin (NOVOLIN-R) infusion    . potassium chloride 10 mEq (11/18/17 0714)     LOS: 2 days        Dominie Benedick Annett Gulaaniel Sheniya Garciaperez, MD [facility] Pager [phone]

## 2017-11-18 NOTE — Progress Notes (Signed)
Patient with persistent acidosis, refractive to insulin therapy. Will add bicarb drip and will check ABG.

## 2017-11-19 ENCOUNTER — Telehealth (INDEPENDENT_AMBULATORY_CARE_PROVIDER_SITE_OTHER): Payer: Self-pay | Admitting: "Endocrinology

## 2017-11-19 LAB — BASIC METABOLIC PANEL
ANION GAP: 11 (ref 5–15)
Anion gap: 15 (ref 5–15)
Anion gap: 15 (ref 5–15)
Anion gap: 15 (ref 5–15)
Anion gap: 12 (ref 5–15)
Anion gap: 12 (ref 5–15)
Anion gap: 12 (ref 5–15)
Anion gap: 9 (ref 5–15)
BUN: 21 mg/dL — ABNORMAL HIGH (ref 6–20)
BUN: 21 mg/dL — ABNORMAL HIGH (ref 6–20)
BUN: 22 mg/dL — ABNORMAL HIGH (ref 6–20)
BUN: 23 mg/dL — ABNORMAL HIGH (ref 6–20)
BUN: 23 mg/dL — ABNORMAL HIGH (ref 6–20)
BUN: 23 mg/dL — ABNORMAL HIGH (ref 6–20)
BUN: 25 mg/dL — ABNORMAL HIGH (ref 6–20)
BUN: 25 mg/dL — ABNORMAL HIGH (ref 6–20)
BUN: 26 mg/dL — ABNORMAL HIGH (ref 6–20)
CALCIUM: 7.9 mg/dL — AB (ref 8.9–10.3)
CALCIUM: 8.2 mg/dL — AB (ref 8.9–10.3)
CALCIUM: 8.1 mg/dL — AB (ref 8.9–10.3)
CALCIUM: 8 mg/dL — AB (ref 8.9–10.3)
CALCIUM: 8.1 mg/dL — AB (ref 8.9–10.3)
CALCIUM: 8.2 mg/dL — AB (ref 8.9–10.3)
CO2: 10 mmol/L — AB (ref 22–32)
CO2: 10 mmol/L — AB (ref 22–32)
CO2: 10 mmol/L — AB (ref 22–32)
CO2: 11 mmol/L — AB (ref 22–32)
CO2: 12 mmol/L — AB (ref 22–32)
CO2: 9 mmol/L — AB (ref 22–32)
CO2: 9 mmol/L — AB (ref 22–32)
CO2: 9 mmol/L — ABNORMAL LOW (ref 22–32)
CO2: <7 mmol/L — ABNORMAL LOW (ref 22–32)
CREATININE: 0.99 mg/dL (ref 0.61–1.24)
CREATININE: 0.95 mg/dL (ref 0.61–1.24)
CREATININE: 0.98 mg/dL (ref 0.61–1.24)
CREATININE: 0.92 mg/dL (ref 0.61–1.24)
Calcium: 7.8 mg/dL — ABNORMAL LOW (ref 8.9–10.3)
Calcium: 7.8 mg/dL — ABNORMAL LOW (ref 8.9–10.3)
Calcium: 8 mg/dL — ABNORMAL LOW (ref 8.9–10.3)
Chloride: 112 mmol/L — ABNORMAL HIGH (ref 101–111)
Chloride: 113 mmol/L — ABNORMAL HIGH (ref 101–111)
Chloride: 114 mmol/L — ABNORMAL HIGH (ref 101–111)
Chloride: 114 mmol/L — ABNORMAL HIGH (ref 101–111)
Chloride: 114 mmol/L — ABNORMAL HIGH (ref 101–111)
Chloride: 116 mmol/L — ABNORMAL HIGH (ref 101–111)
Chloride: 116 mmol/L — ABNORMAL HIGH (ref 101–111)
Chloride: 117 mmol/L — ABNORMAL HIGH (ref 101–111)
Chloride: 117 mmol/L — ABNORMAL HIGH (ref 101–111)
Creatinine, Ser: 0.91 mg/dL (ref 0.61–1.24)
Creatinine, Ser: 0.92 mg/dL (ref 0.61–1.24)
Creatinine, Ser: 0.96 mg/dL (ref 0.61–1.24)
Creatinine, Ser: 1 mg/dL (ref 0.61–1.24)
Creatinine, Ser: 1.12 mg/dL (ref 0.61–1.24)
GFR calc Af Amer: >60 mL/min (ref >60)
GFR calc Af Amer: >60 mL/min (ref >60)
GFR calc Af Amer: >60 mL/min (ref >60)
GFR calc Af Amer: >60 mL/min (ref >60)
GFR calc non Af Amer: >60 mL/min (ref >60)
GFR calc non Af Amer: >60 mL/min (ref >60)
GFR calc non Af Amer: >60 mL/min (ref >60)
GFR calc non Af Amer: >60 mL/min (ref >60)
GFR calc non Af Amer: >60 mL/min (ref >60)
GLUCOSE: 165 mg/dL — AB (ref 65–99)
Glucose, Bld: 150 mg/dL — ABNORMAL HIGH (ref 65–99)
Glucose, Bld: 151 mg/dL — ABNORMAL HIGH (ref 65–99)
Glucose, Bld: 167 mg/dL — ABNORMAL HIGH (ref 65–99)
Glucose, Bld: 174 mg/dL — ABNORMAL HIGH (ref 65–99)
Glucose, Bld: 179 mg/dL — ABNORMAL HIGH (ref 65–99)
Glucose, Bld: 226 mg/dL — ABNORMAL HIGH (ref 65–99)
Glucose, Bld: 247 mg/dL — ABNORMAL HIGH (ref 65–99)
Glucose, Bld: 247 mg/dL — ABNORMAL HIGH (ref 65–99)
Potassium: 2.8 mmol/L — ABNORMAL LOW (ref 3.5–5.1)
Potassium: 2.9 mmol/L — ABNORMAL LOW (ref 3.5–5.1)
Potassium: 3 mmol/L — ABNORMAL LOW (ref 3.5–5.1)
Potassium: 3 mmol/L — ABNORMAL LOW (ref 3.5–5.1)
Potassium: 3.1 mmol/L — ABNORMAL LOW (ref 3.5–5.1)
Potassium: 3.1 mmol/L — ABNORMAL LOW (ref 3.5–5.1)
Potassium: 3.2 mmol/L — ABNORMAL LOW (ref 3.5–5.1)
Potassium: 3.6 mmol/L (ref 3.5–5.1)
Potassium: 4.5 mmol/L (ref 3.5–5.1)
SODIUM: 139 mmol/L (ref 135–145)
SODIUM: 138 mmol/L (ref 135–145)
Sodium: 134 mmol/L — ABNORMAL LOW (ref 135–145)
Sodium: 136 mmol/L (ref 135–145)
Sodium: 137 mmol/L (ref 135–145)
Sodium: 137 mmol/L (ref 135–145)
Sodium: 138 mmol/L (ref 135–145)
Sodium: 138 mmol/L (ref 135–145)
Sodium: 139 mmol/L (ref 135–145)

## 2017-11-19 LAB — GLUCOSE, CAPILLARY
GLUCOSE-CAPILLARY: 113 mg/dL — AB (ref 65–99)
GLUCOSE-CAPILLARY: 131 mg/dL — AB (ref 65–99)
GLUCOSE-CAPILLARY: 133 mg/dL — AB (ref 65–99)
GLUCOSE-CAPILLARY: 158 mg/dL — AB (ref 65–99)
GLUCOSE-CAPILLARY: 159 mg/dL — AB (ref 65–99)
GLUCOSE-CAPILLARY: 170 mg/dL — AB (ref 65–99)
GLUCOSE-CAPILLARY: 176 mg/dL — AB (ref 65–99)
GLUCOSE-CAPILLARY: 180 mg/dL — AB (ref 65–99)
GLUCOSE-CAPILLARY: 192 mg/dL — AB (ref 65–99)
GLUCOSE-CAPILLARY: 202 mg/dL — AB (ref 65–99)
GLUCOSE-CAPILLARY: 208 mg/dL — AB (ref 65–99)
GLUCOSE-CAPILLARY: 230 mg/dL — AB (ref 65–99)
Glucose-Capillary: 122 mg/dL — ABNORMAL HIGH (ref 65–99)
Glucose-Capillary: 133 mg/dL — ABNORMAL HIGH (ref 65–99)
Glucose-Capillary: 141 mg/dL — ABNORMAL HIGH (ref 65–99)
Glucose-Capillary: 144 mg/dL — ABNORMAL HIGH (ref 65–99)
Glucose-Capillary: 144 mg/dL — ABNORMAL HIGH (ref 65–99)
Glucose-Capillary: 145 mg/dL — ABNORMAL HIGH (ref 65–99)
Glucose-Capillary: 150 mg/dL — ABNORMAL HIGH (ref 65–99)
Glucose-Capillary: 177 mg/dL — ABNORMAL HIGH (ref 65–99)
Glucose-Capillary: 183 mg/dL — ABNORMAL HIGH (ref 65–99)
Glucose-Capillary: 197 mg/dL — ABNORMAL HIGH (ref 65–99)
Glucose-Capillary: 211 mg/dL — ABNORMAL HIGH (ref 65–99)

## 2017-11-19 LAB — MAGNESIUM: Magnesium: 2.4 mg/dL (ref 1.7–2.4)

## 2017-11-19 LAB — BLOOD GAS, ARTERIAL
Drawn by: 11249
FIO2: 21
O2 SAT: 98.4 %
PATIENT TEMPERATURE: 36.9
pH, Arterial: 7.199 — CL (ref 7.350–7.450)
pO2, Arterial: 117 mmHg — ABNORMAL HIGH (ref 83.0–108.0)

## 2017-11-19 MED ORDER — DEXTROSE-NACL 5-0.45 % IV SOLN
INTRAVENOUS | Status: DC
  Administered 2017-11-19: 11:00:00 via INTRAVENOUS

## 2017-11-19 MED ORDER — POTASSIUM CHLORIDE 10 MEQ/100ML IV SOLN
10.0000 meq | INTRAVENOUS | Status: AC
  Administered 2017-11-19 (×2): 10 meq via INTRAVENOUS
  Filled 2017-11-19 (×2): qty 100

## 2017-11-19 MED ORDER — DEXTROSE 10 % IV SOLN
INTRAVENOUS | Status: DC
Start: 2017-11-19 — End: 2017-11-20
  Administered 2017-11-19 – 2017-11-20 (×2): via INTRAVENOUS
  Filled 2017-11-19 (×4): qty 1000

## 2017-11-19 MED ORDER — POTASSIUM CHLORIDE 10 MEQ/100ML IV SOLN
10.0000 meq | INTRAVENOUS | Status: AC
  Administered 2017-11-19 (×6): 10 meq via INTRAVENOUS
  Filled 2017-11-19 (×6): qty 100

## 2017-11-19 MED ORDER — POTASSIUM CHLORIDE 10 MEQ/100ML IV SOLN
10.0000 meq | INTRAVENOUS | Status: AC
  Administered 2017-11-19 – 2017-11-20 (×4): 10 meq via INTRAVENOUS
  Filled 2017-11-19 (×4): qty 100

## 2017-11-19 MED ORDER — SODIUM CHLORIDE 0.9 % IV SOLN: INTRAVENOUS | Status: DC

## 2017-11-19 MED ORDER — ORAL CARE MOUTH RINSE
15.0000 mL | Freq: Two times a day (BID) | OROMUCOSAL | Status: DC
  Administered 2017-11-19 – 2017-11-22 (×6): 15 mL via OROMUCOSAL

## 2017-11-19 NOTE — Progress Notes (Addendum)
PROGRESS NOTE   LORENA CLEARMAN  WUJ:[id number] DOB: [birth year] DOA: 11/16/2017   PCP: [name], MD   Brief Narrative:  Patient's 52 year old male with history of diabetes type 2 and hyperlipidemia, presented to emergency department with main concern of one week duration of progressively worsening nausea and vomiting, poor oral intake. Also note, patient had a recent change in antihyperglycemic regimen due to insurance coverage, has not been on insulin therapy before. On admission, patient noted to be in DKA with bicarbonate less than 7 and glucose greater than 300. Patient was admitted to step down unit for further evaluation and management.  Assessment & Plan:   Principal Problem:   DKA (diabetic ketoacidoses) (HCC) - Suspected to be due to empagliflozin  - Anion gap still elevated, 15 this morning, bicarb still low 9 - per protocol, will change fluids to D5 1/2 NS and increase the rate to 150cc/hr - repeat BMP Q4 hours  - continue to follow protocol   Active Problems:   Leukocytosis - Suspect to be reactive from acute illness outlined above - No evidence of an infectious etiology - White blood cell count continues trending down - CBC in the morning    Tachycardia - Also reactive, overall better - Supportive care as outlined above    Dyslipidemia - Continue statin    GERD - Continue pantoprazole    Morbid obesity - Body mass index is 40.26 kg/m.   DVT prophylaxis: enoxaparin Code Status: full Family Communication:  patient updated at bedside  Disposition Plan:  plan to discharge home once patient medically stable   Consultants:    None   Procedures:    None  Antimicrobials:     None   Subjective: Patient reports feeling slightly better but still experiences flushing, fatigue, poor oral intake.   Objective: Vitals:   11/19/17 0200 11/19/17 0400 11/19/17 0600 11/19/17 0800  BP: (!) 129/46 137/67 (!) 128/50 (!) 123/50  Pulse: (!) 110 (!) 117 95  (!) 101  Resp: (!) 28 18 18 19   Temp: 97.7 F (36.5 C) 98.4 F (36.9 C) 98.6 F (37 C) 98.8 F (37.1 C)  TempSrc: Bladder Bladder Bladder   SpO2:    99%  Weight:      Height:        Intake/Output Summary (Last 24 hours) at 11/19/2017 1022 Last data filed at 11/19/2017 0900 Gross per 24 hour  Intake 2711.53 ml  Output 5300 ml  Net -2588.47 ml   Filed Weights   11/17/17 0001 11/17/17 0030  Weight: 99.8 kg (220 lb) (!) 138.4 kg (305 lb 1.9 oz)    Physical Exam  Constitutional: Appears  somnolent but easy to awake, not in acute distress  CVS: Tachycardic, S1/S2 +, no murmurs, no gallops, no carotid bruit.  Pulmonary: Effort and breath sounds normal, no stridor, rhonchi, wheezes, rales.  Abdominal: Soft. BS +,  no distension, tenderness, rebound or guarding.  Musculoskeletal: Normal range of motion. trace bilateral lower extremity edema  Neuro:somnolent but easy to awake, follows commands appropriately, moving all 4 extremities spontaneously   Data Reviewed: I have personally reviewed following labs and imaging studies  CBC: Recent Labs  Lab 11/16/17 1959 11/17/17 0517 11/18/17 0319  WBC 24.9* 22.2* 17.5*  NEUTROABS 21.8*  --  14.9*  HGB 17.0 15.6 15.4  HCT 50.5 45.3 45.6  MCV 98.1 96.4 97.6  PLT 385 306 308   Basic Metabolic Panel: Recent Labs  Lab 11/18/17 1219 11/18/17 1605 11/18/17 1958 11/18/17 2358  11/19/17 0310  NA 131* 133* 132* 134* 137  K 4.0 4.7 4.3 4.5 3.6  CL 111 116* 112* 112* 113*  CO2 <7* <7* <7* <7* 9*  GLUCOSE 162* 126* 180* 167* 150*  BUN 21* 22* 23* 23* 25*  CREATININE 0.91 0.89 0.93 1.12 0.96  CALCIUM 7.5* 7.8* 7.7* 7.8* 8.0*   Liver Function Tests: Recent Labs  Lab 11/16/17 1959  AST 17  ALT 23  ALKPHOS 98  BILITOT 1.4*  PROT 9.1*  ALBUMIN 4.3   Recent Labs  Lab 11/16/17 1959  LIPASE 20   Cardiac Enzymes: Recent Labs  Lab 11/17/17 0024 11/17/17 0517 11/17/17 0903  TROPONINI <0.03 <0.03 <0.03   CBG: Recent Labs    Lab 11/19/17 0558 11/19/17 0654 11/19/17 0801 11/19/17 0913 11/19/17 1018  GLUCAP 177* 197* 113* 133* 122*   Thyroid Function Tests: Recent Labs    11/17/17 0510  TSH 1.578   Radiology Studies: I have reviewed all of the imaging studies as well as blood work during this hospital stay   Scheduled Meds: . enoxaparin (LOVENOX) injection  40 mg Subcutaneous Daily  . pantoprazole  40 mg Oral Daily  . simvastatin  10 mg Oral QHS   Continuous Infusions: . insulin (NOVOLIN-R) infusion 0.7 Units/hr (11/19/17 0914)  .  sodium bicarbonate  infusion 1000 mL 100 mL/hr at 11/19/17 0400     LOS: 3 days    Debbora PrestoIskra Magick-Seiya Silsby, MD [facility] Pager [phone]  45 minutes spent

## 2017-11-19 NOTE — Progress Notes (Signed)
Inpatient Diabetes Program Recommendations  AACE/ADA: New Consensus Statement on Inpatient Glycemic Control (2015)  Target Ranges:  Prepandial:   less than 140 mg/dL      Peak postprandial:   less than 180 mg/dL (1-2 hours)      Critically ill patients:  140 - 180 mg/dL   Lab Results  Component Value Date   GLUCAP 133 (H) 11/19/2017    Review of Glycemic ControlResults for Principato, Nashid R (MRN [id number]) as of 11/19/2017 10:15  Ref. Range 11/19/2017 04:02 11/19/2017 04:57 11/19/2017 05:58 11/19/2017 06:54 11/19/2017 08:01 11/19/2017 09:13  Glucose-Capillary Latest Ref Range: 65 - 99 mg/dL 147145 (H) 829150 (H) 562177 (H) 197 (H) 113 (H) 133 (H)  Results for Nathaniel ManRAY, Deadrick R (MRN [id number]) as of 11/19/2017 10:15  Ref. Range 11/19/2017 03:10  BASIC METABOLIC PANEL Unknown Rpt (A)  Sodium Latest Ref Range: 135 - 145 mmol/L 137  Potassium Latest Ref Range: 3.5 - 5.1 mmol/L 3.6  Chloride Latest Ref Range: 101 - 111 mmol/L 113 (H)  CO2 Latest Ref Range: 22 - 32 mmol/L 9 (L)  Glucose Latest Ref Range: 65 - 99 mg/dL 696150 (H)  BUN Latest Ref Range: 6 - 20 mg/dL 25 (H)  Creatinine Latest Ref Range: 0.61 - 1.24 mg/dL 2.950.96  Calcium Latest Ref Range: 8.9 - 10.3 mg/dL 8.0 (L)  Anion gap Latest Ref Range: 5 - 15  15  GFR, Est Non African American Latest Ref Range: >60 mL/min >60  GFR, Est African American Latest Ref Range: >60 mL/min >60    Diabetes history: Type 2 DM Outpatient Diabetes medications: Jardiance 10 mg daily, Glipizide 5 mg daily, Metformin 1000 mg bid Current orders for Inpatient glycemic control:  IV insulin/DKA order set  Inpatient Diabetes Program Recommendations:    Note labs.  Patient continues to be acidotic.  Text page sent to MD at 8:15a.  Consider Critical care involvement due to persistent acidosis?  Spoke with RN and she states that glucostabilizer is now telling her to "turn off insulin drip" due to blood sugar.  Likely needs increased dextrose in order for insulin to be infused to  shut off ketoacidosis.  Also may consider endocrinology consult due to SGT-2 prior to DKA admit?    Thanks,  Beryl MeagerJenny Haila Dena, RN, BC-ADM Inpatient Diabetes Coordinator Pager [phone] (8a-5p)

## 2017-11-19 NOTE — Progress Notes (Addendum)
error 

## 2017-11-20 DIAGNOSIS — E1110 Type 2 diabetes mellitus with ketoacidosis without coma: Principal | ICD-10-CM

## 2017-11-20 LAB — GLUCOSE, CAPILLARY
GLUCOSE-CAPILLARY: 210 mg/dL — AB (ref 65–99)
GLUCOSE-CAPILLARY: 139 mg/dL — AB (ref 65–99)
GLUCOSE-CAPILLARY: 142 mg/dL — AB (ref 65–99)
GLUCOSE-CAPILLARY: 179 mg/dL — AB (ref 65–99)
GLUCOSE-CAPILLARY: 194 mg/dL — AB (ref 65–99)
GLUCOSE-CAPILLARY: 166 mg/dL — AB (ref 65–99)
GLUCOSE-CAPILLARY: 164 mg/dL — AB (ref 65–99)
GLUCOSE-CAPILLARY: 181 mg/dL — AB (ref 65–99)
GLUCOSE-CAPILLARY: 202 mg/dL — AB (ref 65–99)
GLUCOSE-CAPILLARY: 138 mg/dL — AB (ref 65–99)
GLUCOSE-CAPILLARY: 244 mg/dL — AB (ref 65–99)
GLUCOSE-CAPILLARY: 255 mg/dL — AB (ref 65–99)
GLUCOSE-CAPILLARY: 142 mg/dL — AB (ref 65–99)
Glucose-Capillary: 126 mg/dL — ABNORMAL HIGH (ref 65–99)
Glucose-Capillary: 138 mg/dL — ABNORMAL HIGH (ref 65–99)
Glucose-Capillary: 154 mg/dL — ABNORMAL HIGH (ref 65–99)
Glucose-Capillary: 155 mg/dL — ABNORMAL HIGH (ref 65–99)
Glucose-Capillary: 160 mg/dL — ABNORMAL HIGH (ref 65–99)
Glucose-Capillary: 167 mg/dL — ABNORMAL HIGH (ref 65–99)
Glucose-Capillary: 180 mg/dL — ABNORMAL HIGH (ref 65–99)
Glucose-Capillary: 186 mg/dL — ABNORMAL HIGH (ref 65–99)
Glucose-Capillary: 189 mg/dL — ABNORMAL HIGH (ref 65–99)

## 2017-11-20 LAB — BASIC METABOLIC PANEL
Anion gap: 9 (ref 5–15)
Anion gap: 11 (ref 5–15)
Anion gap: 8 (ref 5–15)
Anion gap: 6 (ref 5–15)
Anion gap: 7 (ref 5–15)
BUN: 20 mg/dL (ref 6–20)
BUN: 19 mg/dL (ref 6–20)
BUN: 18 mg/dL (ref 6–20)
BUN: 17 mg/dL (ref 6–20)
BUN: 14 mg/dL (ref 6–20)
CHLORIDE: 114 mmol/L — AB (ref 101–111)
CHLORIDE: 113 mmol/L — AB (ref 101–111)
CHLORIDE: 112 mmol/L — AB (ref 101–111)
CO2: 14 mmol/L — AB (ref 22–32)
CO2: 13 mmol/L — ABNORMAL LOW (ref 22–32)
CO2: 14 mmol/L — AB (ref 22–32)
CO2: 18 mmol/L — AB (ref 22–32)
CO2: 17 mmol/L — AB (ref 22–32)
CREATININE: 0.77 mg/dL (ref 0.61–1.24)
CREATININE: 0.75 mg/dL (ref 0.61–1.24)
CREATININE: 0.6 mg/dL — AB (ref 0.61–1.24)
CREATININE: 0.64 mg/dL (ref 0.61–1.24)
Calcium: 8.3 mg/dL — ABNORMAL LOW (ref 8.9–10.3)
Calcium: 8.3 mg/dL — ABNORMAL LOW (ref 8.9–10.3)
Calcium: 8.2 mg/dL — ABNORMAL LOW (ref 8.9–10.3)
Calcium: 8.2 mg/dL — ABNORMAL LOW (ref 8.9–10.3)
Calcium: 8.2 mg/dL — ABNORMAL LOW (ref 8.9–10.3)
Chloride: 116 mmol/L — ABNORMAL HIGH (ref 101–111)
Chloride: 114 mmol/L — ABNORMAL HIGH (ref 101–111)
Creatinine, Ser: 0.62 mg/dL (ref 0.61–1.24)
GFR calc Af Amer: >60 mL/min (ref >60)
GFR calc Af Amer: >60 mL/min (ref >60)
GFR calc Af Amer: >60 mL/min (ref >60)
GFR calc Af Amer: >60 mL/min (ref >60)
GFR calc Af Amer: >60 mL/min (ref >60)
GFR calc non Af Amer: >60 mL/min (ref >60)
GFR calc non Af Amer: >60 mL/min (ref >60)
GFR calc non Af Amer: >60 mL/min (ref >60)
GFR calc non Af Amer: >60 mL/min (ref >60)
GLUCOSE: 142 mg/dL — AB (ref 65–99)
GLUCOSE: 174 mg/dL — AB (ref 65–99)
GLUCOSE: 173 mg/dL — AB (ref 65–99)
GLUCOSE: 165 mg/dL — AB (ref 65–99)
GLUCOSE: 232 mg/dL — AB (ref 65–99)
POTASSIUM: 2.8 mmol/L — AB (ref 3.5–5.1)
POTASSIUM: 2.9 mmol/L — AB (ref 3.5–5.1)
POTASSIUM: 2.7 mmol/L — AB (ref 3.5–5.1)
POTASSIUM: 2.9 mmol/L — AB (ref 3.5–5.1)
Potassium: 3 mmol/L — ABNORMAL LOW (ref 3.5–5.1)
SODIUM: 138 mmol/L (ref 135–145)
SODIUM: 136 mmol/L (ref 135–145)
SODIUM: 137 mmol/L (ref 135–145)
SODIUM: 136 mmol/L (ref 135–145)
Sodium: 139 mmol/L (ref 135–145)

## 2017-11-20 LAB — CBC
HCT: 40.7 % (ref 39.0–52.0)
Hemoglobin: 14.2 g/dL (ref 13.0–17.0)
MCH: 32.7 pg (ref 26.0–34.0)
MCHC: 34.9 g/dL (ref 30.0–36.0)
MCV: 93.8 fL (ref 78.0–100.0)
PLATELETS: 174 10*3/uL (ref 150–400)
RBC: 4.34 MIL/uL (ref 4.22–5.81)
RDW: 14.2 % (ref 11.5–15.5)
WBC: 9 10*3/uL (ref 4.0–10.5)

## 2017-11-20 LAB — LACTIC ACID, PLASMA
LACTIC ACID, VENOUS: 1.3 mmol/L (ref 0.5–1.9)
Lactic Acid, Venous: 1.5 mmol/L (ref 0.5–1.9)

## 2017-11-20 LAB — BETA-HYDROXYBUTYRIC ACID: BETA-HYDROXYBUTYRIC ACID: 0.65 mmol/L — AB (ref 0.05–0.27)

## 2017-11-20 MED ORDER — MAGNESIUM SULFATE 2 GM/50ML IV SOLN
2.0000 g | Freq: Once | INTRAVENOUS | Status: AC
  Administered 2017-11-20: 2 g via INTRAVENOUS
  Filled 2017-11-20: qty 50

## 2017-11-20 MED ORDER — SODIUM CHLORIDE 0.9 % IV SOLN: INTRAVENOUS | Status: DC

## 2017-11-20 MED ORDER — SODIUM CHLORIDE 4 MEQ/ML IV SOLN
INTRAVENOUS | Status: DC
  Administered 2017-11-20 – 2017-11-21 (×3): via INTRAVENOUS
  Filled 2017-11-20 (×13): qty 1000

## 2017-11-20 MED ORDER — POTASSIUM CHLORIDE 10 MEQ/100ML IV SOLN
10.0000 meq | INTRAVENOUS | Status: AC
  Administered 2017-11-20 (×5): 10 meq via INTRAVENOUS
  Filled 2017-11-20 (×4): qty 100

## 2017-11-20 MED ORDER — POTASSIUM CHLORIDE 10 MEQ/100ML IV SOLN
10.0000 meq | INTRAVENOUS | Status: AC
  Administered 2017-11-20 – 2017-11-21 (×6): 10 meq via INTRAVENOUS
  Filled 2017-11-20 (×6): qty 100

## 2017-11-20 NOTE — Progress Notes (Signed)
CRITICAL VALUE ALERT  Critical Value:  K+ 2.7  Date & Time Notied:  11/20/17 @1731   Provider Notified: Dr. Delton CoombesByrum  Orders Received/Actions taken: MD notified. Awaiting orders. Will continue to monitor

## 2017-11-20 NOTE — Consult Note (Signed)
PULMONARY / CRITICAL CARE MEDICINE   Name: Shawn Anthony MRN: [id number] DOB: 28-Jun-1966    ADMISSION DATE:  11/16/2017 CONSULTATION DATE:  11/20/16  REFERRING MD: Dr. Izola Price  CHIEF COMPLAINT: Abdominal pain  HISTORY OF PRESENT ILLNESS:  52 y/o male who came to the ED on 1/13 for several days of abdominal pain. He has a known medical history of DM on oral meds and hyperlipidemia. He also has recently been taking an herbal supplement for "health".  He was found to have a glucose of 346 and suspected to have DKA. He was admitted for treatment on 1/13.  Home Jardiance, metformin and glipizide were held.  He was treated with IV insulin, NPO and IVF.  On 1/14, the patient was transitioned off insulin gtt to long acting insulin (CO2 <7, gap not calculated).  Unfortunately, his DKA did not resolve.    PCCM consulted for evaluation of DKA.    PAST MEDICAL HISTORY :  He  has a past medical history of Diabetes mellitus and Hyperlipidemia.  PAST SURGICAL HISTORY: He  has a past surgical history that includes Shoulder surgery.  No Known Allergies  No current facility-administered medications on file prior to encounter.    Current Outpatient Medications on File Prior to Encounter  Medication Sig  . empagliflozin (JARDIANCE) 10 MG TABS tablet Take 10 mg by mouth daily.  Marland Kitchen glipiZIDE (GLUCOTROL) 5 MG tablet Take 5 mg by mouth daily before breakfast.  . metFORMIN (GLUCOPHAGE) 500 MG tablet Take 1,000 mg by mouth 2 (two) times daily with a meal.   . Multiple Vitamin (MULTIVITAMIN WITH MINERALS) TABS tablet Take 1 tablet by mouth daily.  Marland Kitchen omeprazole (PRILOSEC) 20 MG capsule Take 20 mg by mouth daily.  Marland Kitchen OVER THE COUNTER MEDICATION Take 1 tablet by mouth every other day.  . simvastatin (ZOCOR) 10 MG tablet Take 10 mg by mouth at bedtime.    FAMILY HISTORY:  His indicated that his mother is alive. He indicated that his father is alive.   SOCIAL HISTORY: He  reports that  has never smoked. he has  never used smokeless tobacco. He reports that he does not drink alcohol or use drugs.  REVIEW OF SYSTEMS:  Unable to complete due to altered mental status in setting of DKA  SUBJECTIVE:   VITAL SIGNS: BP (!) 127/58   Pulse 98   Temp 98.8 F (37.1 C)   Resp (!) 24   Ht 6\' 1"  (1.854 m)   Wt (!) 305 lb 1.9 oz (138.4 kg)   SpO2 99%   BMI 40.26 kg/m   HEMODYNAMICS:    VENTILATOR SETTINGS:    INTAKE / OUTPUT: I/O last 3 completed shifts: In: 4960.2 [P.O.:670; I.V.:3790.2; IV Piggyback:500] Out: 6945 [Urine:6945]  PHYSICAL EXAMINATION: General: obese adult male lying in bed HEENT: MM pink/dry  Neuro: Awakens, alert, MAE CV: s1s2 rrr, no m/r/g PULM: even/non-labored, lungs bilaterally coarse  WU:JWJX, non-tender, bsx4 active  Extremities: warm/dry, no edema  Skin: no rashes or lesions  LABS:  BMET Recent Labs  Lab 11/19/17 2233 11/20/17 0252 11/20/17 0737  NA 138 139 138  K 2.9* 3.0* 2.8*  CL 117* 116* 114*  CO2 12* 14* 13*  BUN 21* 20 19  CREATININE 0.92 0.77 0.75  GLUCOSE 165* 142* 174*    Electrolytes Recent Labs  Lab 11/19/17 1106  11/19/17 2233 11/20/17 0252 11/20/17 0737  CALCIUM 7.9*   < > 8.2* 8.3* 8.3*  MG 2.4  --   --   --   --    < > =  values in this interval not displayed.    CBC Recent Labs  Lab 11/17/17 0517 11/18/17 0319 11/20/17 0252  WBC 22.2* 17.5* 9.0  HGB 15.6 15.4 14.2  HCT 45.3 45.6 40.7  PLT 306 308 174    Coag's No results for input(s): APTT, INR in the last 168 hours.  Sepsis Markers Recent Labs  Lab 11/20/17 0852  LATICACIDVEN 1.3    ABG Recent Labs  Lab 11/18/17 1823 11/18/17 2214 11/19/17 0453  PHART 6.990* 7.057* 7.199*  PCO2ART BELOW REPORTABLE RANGE CRITICAL RESULT CALLED TO, READ BACK BY AND VERIFIED WITH: CRITICAL RESULT CALLED TO, READ BACK BY AND VERIFIED WITH:  PO2ART 128* 117* 117*    Liver Enzymes Recent Labs  Lab 11/16/17 1959  AST 17  ALT 23  ALKPHOS 98  BILITOT 1.4*  ALBUMIN  4.3    Cardiac Enzymes Recent Labs  Lab 11/17/17 0024 11/17/17 0517 11/17/17 0903  TROPONINI <0.03 <0.03 <0.03    Glucose Recent Labs  Lab 11/20/17 0502 11/20/17 0556 11/20/17 0646 11/20/17 0732 11/20/17 0853 11/20/17 1007  GLUCAP 186* 167* 179* 155* 194* 166*    Imaging No results found.   STUDIES:  CT ABD/Pelvis 1/13 >> no evidence of bowel obstruction or acute inflammation, diffuse hepatic statosis  CULTURES:   ANTIBIOTICS:   SIGNIFICANT EVENTS: 1/13  Admit with DKA   LINES/TUBES:   DISCUSSION: 52 y/o M admitted with DKA.    ASSESSMENT / PLAN:  ENDOCRINE A:   DKA    P:   Continue IVF as below Insulin gtt per protocol  Do not turn insulin gtt off until AG closed / Bicarb > 20  RENAL A:   Hypokalemia  Hyperchloremia  P:   IVF as above Trend BMP Q4  Trend BMP / urinary output Replace electrolytes as indicated Avoid nephrotoxic agents, ensure adequate renal perfusion  PULMONARY  A: Suspected Sleep Apnea P: Consider outpatient sleep evaluation  CARDIOVASCULAR A:  Tachycardia P:  Monitor in SDU  Continue IVF > D10 1/5NS with 40 KCL at 14425ml/hr  NEUROLOGIC A:   Acute Encephalopathy  P:   Supportive care Correct underlying DKA    FAMILY  - Updates: No family available at bedside 1/17.     Canary BrimBrandi Tekeshia Klahr, NP-C [facility] Pgr: [phone] or if no answer [phone] 11/20/2017, 12:33 PM

## 2017-11-20 NOTE — Progress Notes (Signed)
PROGRESS NOTE   Shawn Anthony  ZOX:[id number] DOB: 09-19-1966 DOA: 11/16/2017   PCP: [name], MD   Brief Narrative:  Patient's 52 year old male with history of diabetes type 2 and hyperlipidemia, presented to emergency department with main concern of one week duration of progressively worsening nausea and vomiting, poor oral intake. Also note, patient had a recent change in antihyperglycemic regimen due to insurance coverage, has not been on insulin therapy before. On admission, patient noted to be in DKA with bicarbonate less than 7 and glucose greater than 300. Patient was admitted to step down unit for further evaluation and management.  Addendum: Spoke with pt's spouse this AM, he reports patient has never been in DKA. Was on Invokana for two years in the past and was taken off of it for about a year. Pt was placed on Glipizide and Metformin and was doing OK for a while. [place] was started on January 10th, 2019. Spouse says pt drinks no alcohol and does not take any drugs, has not been taking any NSAID's or aspirin. Pt's spouse says pt is on several OTC supplements and vitamins.   Assessment & Plan:   Principal Problem:   DKA (diabetic ketoacidoses) (HCC) - Suspected to be due to empagliflozin but still somewhat unclear  - IVF have been changed to D10 1/2 NS as was recommended - Dr. Sharl Ma has graciously agreed to consult on the pt as well, assistance greatly appreciated  - Bicarb is slowly trending down - Pt reports feeling somewhat better - will keep in SDU - BMP Q4 hours  - appreciate PCCM assistance   Active Problems:   Hypokalemia - continue to supplement - BMP as noted above     Leukocytosis - Suspect to be reactive from acute illness outlined above - No evidence of an infectious etiology - WBC is now WNL     Tachycardia - Also reactive, overall better - supportive care as outlined above     Dyslipidemia - Continue statin    GERD - Continue pantoprazole   Morbid obesity - Body mass index is 40.26 kg/m.   DVT prophylaxis: enoxaparin Code Status: full Family Communication:  pt ans spouse at bedside  Disposition Plan:  plan to discharge home once patient medically stable   Consultants:    PCCM  Endocrinology   Procedures:    None  Antimicrobials:     None  Subjective: Pt reports feeling better.   Objective: Vitals:   11/20/17 0600 11/20/17 0800 11/20/17 0900 11/20/17 1000  BP: (!) 126/17 (!) 162/75  (!) 127/58  Pulse: 93 89 91 98  Resp: 17 16 19  (!) 24  Temp: 99 F (37.2 C) 98.8 F (37.1 C) 98.6 F (37 C) 98.8 F (37.1 C)  TempSrc:      SpO2:  99% 100% 99%  Weight:      Height:        Intake/Output Summary (Last 24 hours) at 11/20/2017 1406 Last data filed at 11/20/2017 0901 Gross per 24 hour  Intake 3782.53 ml  Output 2970 ml  Net 812.53 ml   Filed Weights   11/17/17 0001 11/17/17 0030  Weight: 99.8 kg (220 lb) (!) 138.4 kg (305 lb 1.9 oz)    Physical Exam  Constitutional: Appears calm ,NAD CVS: RRR, S1/S2 +, no murmurs, no gallops, no carotid bruit.  Pulmonary: Effort and breath sounds normal, no stridor, rhonchi, wheezes, rales.  Abdominal: Soft. BS +,  no distension, tenderness, rebound or guarding.  Musculoskeletal: Normal range  of motion. No edema and no tenderness.  Neuro: Alert but still somewhat tired and ill appearing, strength equal bilaterally   Data Reviewed: I have personally reviewed following labs and imaging studies  CBC: Recent Labs  Lab 11/16/17 1959 11/17/17 0517 11/18/17 0319 11/20/17 0252  WBC 24.9* 22.2* 17.5* 9.0  NEUTROABS 21.8*  --  14.9*  --   HGB 17.0 15.6 15.4 14.2  HCT 50.5 45.3 45.6 40.7  MCV 98.1 96.4 97.6 93.8  PLT 385 306 308 174   Basic Metabolic Panel: Recent Labs  Lab 11/19/17 1106  11/19/17 2128 11/19/17 2233 11/20/17 0252 11/20/17 0737 11/20/17 1237  NA 139   < > 136 138 139 138 136  K 2.8*   < > 3.2* 2.9* 3.0* 2.8* 2.9*  CL 114*   < >  114* 117* 116* 114* 114*  CO2 10*   < > 10* 12* 14* 13* 14*  GLUCOSE 151*   < > 247* 165* 142* 174* 173*  BUN 26*   < > 21* 21* 20 19 18   CREATININE 0.99   < > 0.91 0.92 0.77 0.75 0.62  CALCIUM 7.9*   < > 8.1* 8.2* 8.3* 8.3* 8.2*  MG 2.4  --   --   --   --   --   --    < > = values in this interval not displayed.   Liver Function Tests: Recent Labs  Lab 11/16/17 1959  AST 17  ALT 23  ALKPHOS 98  BILITOT 1.4*  PROT 9.1*  ALBUMIN 4.3   Recent Labs  Lab 11/16/17 1959  LIPASE 20   Cardiac Enzymes: Recent Labs  Lab 11/17/17 0024 11/17/17 0517 11/17/17 0903  TROPONINI <0.03 <0.03 <0.03   CBG: Recent Labs  Lab 11/20/17 0853 11/20/17 1007 11/20/17 1117 11/20/17 1227 11/20/17 1341  GLUCAP 194* 166* 164* 181* 202*   Radiology Studies: I have reviewed all of the imaging studies as well as blood work during this hospital stay   Scheduled Meds: . enoxaparin (LOVENOX) injection  40 mg Subcutaneous Daily  . mouth rinse  15 mL Mouth Rinse BID  . pantoprazole  40 mg Oral Daily  . simvastatin  10 mg Oral QHS   Continuous Infusions: . D-10-0.45% Sodium Chloride with KCL 40 meq/L 1000 ml 125 mL/hr at 11/20/17 1130  . insulin (NOVOLIN-R) infusion 7.2 Units/hr (11/20/17 1008)     LOS: 4 days    Debbora PrestoIskra Magick-Gisele Pack, MD [facility] Pager [phone]  35 minutes spent

## 2017-11-20 NOTE — Progress Notes (Addendum)
Inpatient Diabetes Program Recommendations  AACE/ADA: New Consensus Statement on Inpatient Glycemic Control (2015)  Target Ranges:  Prepandial:   less than 140 mg/dL      Peak postprandial:   less than 180 mg/dL (1-2 hours)      Critically ill patients:  140 - 180 mg/dL   Results for Shawn Anthony, Shawn Anthony (MRN [id number]) as of 11/20/2017 09:36  Ref. Range 11/20/2017 07:37  Sodium Latest Ref Range: 135 - 145 mmol/L 138  Potassium Latest Ref Range: 3.5 - 5.1 mmol/L 2.8 (L)  Chloride Latest Ref Range: 101 - 111 mmol/L 114 (H)  CO2 Latest Ref Range: 22 - 32 mmol/L 13 (L)  Glucose Latest Ref Range: 65 - 99 mg/dL 409174 (H)  BUN Latest Ref Range: 6 - 20 mg/dL 19  Creatinine Latest Ref Range: 0.61 - 1.24 mg/dL 8.110.75  Calcium Latest Ref Range: 8.9 - 10.3 mg/dL 8.3 (L)  Anion gap Latest Ref Range: 5 - 15  11    Diabetes history: Type 2 DM  Outpatient DM  meds:Jardiance 10 mg daily    Glipizide 5 mg daily    Metformin 1000 mg bid  Current orders:IV insulin/DKA order set      Note labs.  Patient continues to be acidotic.    Note IVF changed to D10% @ 125cc/hr last night around 6pm.    MD- Please consider consult to Critical care team due to persistent acidosis.  Likely needs increased dextrose in order for insulin to be infused to shut off ketoacidosis.  Should we increase D10% IVF to 150cc/hr?    Also may consider endocrinology consult due to SGT-2 prior to DKA admit.  Would not continue Jardiance at time of d/c.  May need Insulin when pt ready for d/c home.     --Will follow patient during hospitalization--  Ambrose FinlandJeannine Johnston Flara Storti RN, MSN, CDE Diabetes Coordinator Inpatient Glycemic Control Team Team Pager: [phone] (8a-5p)

## 2017-11-20 NOTE — Consult Note (Addendum)
Reason for Consult: diuretic ketoacidosis Referring Physician: Dr. Elmarie Mainland is an 52 y.o. male.   History of present illness: several years ago, Shawn Anthony received a diagnosis of type 2 diabetes mellitus. He notes that he recently "when off his diet".  He has taken glipizide, metformin, and Invokana until one week ago when [place] was transitioned to [place].  He recalls feeling ill on Monday. Associated symptoms included labored breathing "I couldn't catch my breath".  He does not recall any similar episodes in the past.  He reports that his grandmother developed type 2 diabetes mellitus.  He has not consumed alcohol for the past 5 months. No recent low-carb diet or fasting.  He avoids aspirin and other salicylates.  His hyperglycemia has been uncontrolled in recent weeks, per his report.  Past Medical History:  Diagnosis Date  . Diabetes mellitus   . Hyperlipidemia     Past Surgical History:  Procedure Laterality Date  . SHOULDER SURGERY      Family History  Problem Relation Age of Onset  . Diabetes Mother     Social History:  reports that  has never smoked. he has never used smokeless tobacco. He reports that he does not drink alcohol or use drugs.  Allergies: No Known Allergies  Medications:  I have reviewed the patient's current medications. Scheduled: . enoxaparin (LOVENOX) injection  40 mg Subcutaneous Daily  . mouth rinse  15 mL Mouth Rinse BID  . pantoprazole  40 mg Oral Daily  . simvastatin  10 mg Oral QHS   Continuous: . D-10-0.45% Sodium Chloride with KCL 40 meq/L 1000 ml 125 mL/hr at 11/20/17 1130  . insulin (NOVOLIN-R) infusion 7.2 Units/hr (11/20/17 1008)   ROS  General: No weight loss, no weight gain. Endocrine:   Polydipsia and polyuria. Eyes:  Chronic blurry vision, no change in vision. Cardiovascular: No edema, no chest pain. Respiratory: No cough. Gastrointestinal: No diarrhea, no constipation. Neurologic: No numbness, no burning in the  feet. Hematologic: Tends to bruise around his ankles, no abnormal bleeding. Skin: No rash on feet, no foot ulcer. Musculoskeletal: No muscle weakness, no muscle aches.  Blood pressure (!) 127/58, pulse 98, temperature 98.8 F (37.1 C), resp. rate (!) 24, height 6\' 1"  (1.854 m), weight (!) 138.4 kg (305 lb 1.9 oz), SpO2 99 %. Physical Exam General: No apparent acute distress, but appears ill. Eyes: Anicteric, pupils equal and round. Neck: Supple, trachea midline. Thyroid: No appreciable enlargement, mobile without fixation, no tenderness. Cardiovascular: Regular rhythm and rate, no murmur, normal dorsalis pedal pulses. Respiratory: Mild generous respiratory effort, clear to auscultation. Gastrointestinal: Normal pitch active bowel sounds, nontender abdomen without distention or appreciable hepatomegaly. Neurologic: Cranial nerves normal as tested, deep tendon reflexes symmetric, normal monofilament sensation in feet. Musculoskeletal: Normal muscle tone, no muscle atrophy. Skin: Appropriate warmth, onychomycosis, ecchymoses around ankles. Mental status: Somnolent, but conversant, speech clear, thought logical, appropriate mood and affect, no hallucinations or delusions evident. Hematologic/lymphatic: No cervical adenopathy, no jaundice.   Lab Results  Component Value Date   GLUCOSE 173 (H) 11/20/2017   GLUCOSE 174 (H) 11/20/2017   GLUCOSE 142 (H) 11/20/2017   GLUCOSE 165 (H) 11/19/2017   GLUCOSE 247 (H) 11/19/2017   GLUCOSE 247 (H) 11/19/2017   GLUCOSE 174 (H) 11/19/2017   GLUCOSE 179 (H) 11/19/2017   GLUCOSE 226 (H) 11/19/2017   GLUCOSE 151 (H) 11/19/2017   CO2 14 (L) 11/20/2017   CO2 13 (L) 11/20/2017   CO2 14 (L) 11/20/2017  CO2 12 (L) 11/19/2017   CO2 10 (L) 11/19/2017   CO2 9 (L) 11/19/2017   CO2 11 (L) 11/19/2017   CO2 10 (L) 11/19/2017   CO2 9 (L) 11/19/2017   CO2 10 (L) 11/19/2017   ANIONGAP 8 11/20/2017   ANIONGAP 11 11/20/2017   ANIONGAP 9 11/20/2017   ANIONGAP  9 11/19/2017   ANIONGAP 12 11/19/2017   ANIONGAP 12 11/19/2017   ANIONGAP 11 11/19/2017   ANIONGAP 12 11/19/2017   ANIONGAP 15 11/19/2017   ANIONGAP 15 11/19/2017   CREATININE 0.62 11/20/2017   PHART 7.199 (LL) 11/19/2017   PHART 7.057 (LL) 11/18/2017   PHART 6.990 (LL) 11/18/2017   KETONESUR 80 (A) 11/16/2017   November 14, 2017: Hemoglobin A1c 12.1% November 19, 2016: Hemoglobin A1c 10.5%  Assessment/Plan: 1.  Diabetic ketoacidosis 2.  Diabetes mellitus with hyperglycemia 3.  Severe obesity 4.  Hepatic steatosis 5.  Family history of diabetes mellitus  For the consultation today, I spoke with the attending physician, reviewed laboratory reports, reviewed office notes, reviewed hospital notes, reviewed CT scan report, reviewed CT scan images (hepatic steatosis), reviewed ECG report, and reviewed ECG tracings (sinus tachycardia on 11/17/2017).    No clear history to suggest salicylate overdose, recent ethanol ingestion, or starvation.  He arrived with a high anion gap metabolic acidosis. He now has a normal anion gap hyperchloremic metabolic acidosis. This is common after treatment for diabetic ketoacidosis, due to the intravenous fluids given. I agree with the half-normal saline to provide some free water.  Urine ketones may remain positive for over 36 hours after resolution of ketoacidosis. I recommend checking beta hydroxybutyrate now. If his anion gap remains below 12 and either his bicarbonate (total CO2) reaches 15 or his venous pH exceeds 7.30, then he should be able to transition from intravenous insulin to the subcutaneous insulin injections (at least if he is eating his meals).    When he is ready to transition, consider starting with around 70 units of insulin per day as an initial total daily dose of insulin.  If eating, this might be accomplished with 35 units of long-acting insulin (such as Lantus) once a day and 12 units of rapid acting insulin (such as NovoLog) at beginning  of meals up to three times a day.  For his body size as an insulin naive person, he may require around one unit of NovoLog insulin to lower his blood glucose by 25 milligram per deciliter (if correction scale or correction bolus is indicated).    Due to his diabetic ketoacidosis, I recommend that Trey PaulaJeff avoids Invokana, [place], Farxiga, similar medicines, or combination medicines containing any from that class of medicines.    Either as an outpatient or as an inpatient, we can also check for evidence of latent autoimmune diabetes of adults. Consider starting that evaluation with a fasting C-peptide level and GAD 65 antibody test.  For the remainder of the hospital stay and initially after he returns home, I recommend insulin therapy.   Florestine Carmical,[place] 11/20/2017, 2:19 PM

## 2017-11-21 LAB — GLUCOSE, CAPILLARY
GLUCOSE-CAPILLARY: 137 mg/dL — AB (ref 65–99)
GLUCOSE-CAPILLARY: 165 mg/dL — AB (ref 65–99)
GLUCOSE-CAPILLARY: 216 mg/dL — AB (ref 65–99)
GLUCOSE-CAPILLARY: 175 mg/dL — AB (ref 65–99)
GLUCOSE-CAPILLARY: 221 mg/dL — AB (ref 65–99)
GLUCOSE-CAPILLARY: 247 mg/dL — AB (ref 65–99)
GLUCOSE-CAPILLARY: 240 mg/dL — AB (ref 65–99)
Glucose-Capillary: 158 mg/dL — ABNORMAL HIGH (ref 65–99)
Glucose-Capillary: 161 mg/dL — ABNORMAL HIGH (ref 65–99)
Glucose-Capillary: 166 mg/dL — ABNORMAL HIGH (ref 65–99)
Glucose-Capillary: 168 mg/dL — ABNORMAL HIGH (ref 65–99)
Glucose-Capillary: 173 mg/dL — ABNORMAL HIGH (ref 65–99)
Glucose-Capillary: 176 mg/dL — ABNORMAL HIGH (ref 65–99)
Glucose-Capillary: 178 mg/dL — ABNORMAL HIGH (ref 65–99)
Glucose-Capillary: 184 mg/dL — ABNORMAL HIGH (ref 65–99)
Glucose-Capillary: 185 mg/dL — ABNORMAL HIGH (ref 65–99)

## 2017-11-21 LAB — CBC
HEMATOCRIT: 38.3 % — AB (ref 39.0–52.0)
HEMOGLOBIN: 13.7 g/dL (ref 13.0–17.0)
MCH: 32.1 pg (ref 26.0–34.0)
MCHC: 35.8 g/dL (ref 30.0–36.0)
MCV: 89.7 fL (ref 78.0–100.0)
Platelets: 165 10*3/uL (ref 150–400)
RBC: 4.27 MIL/uL (ref 4.22–5.81)
RDW: 13.8 % (ref 11.5–15.5)
WBC: 7.1 10*3/uL (ref 4.0–10.5)

## 2017-11-21 LAB — BASIC METABOLIC PANEL
ANION GAP: 6 (ref 5–15)
Anion gap: 4 — ABNORMAL LOW (ref 5–15)
BUN: 13 mg/dL (ref 6–20)
BUN: 12 mg/dL (ref 6–20)
CALCIUM: 8 mg/dL — AB (ref 8.9–10.3)
CALCIUM: 8 mg/dL — AB (ref 8.9–10.3)
CO2: 17 mmol/L — ABNORMAL LOW (ref 22–32)
CO2: 18 mmol/L — ABNORMAL LOW (ref 22–32)
Chloride: 113 mmol/L — ABNORMAL HIGH (ref 101–111)
Chloride: 114 mmol/L — ABNORMAL HIGH (ref 101–111)
Creatinine, Ser: 0.58 mg/dL — ABNORMAL LOW (ref 0.61–1.24)
Creatinine, Ser: 0.55 mg/dL — ABNORMAL LOW (ref 0.61–1.24)
GFR calc Af Amer: >60 mL/min (ref >60)
GLUCOSE: 164 mg/dL — AB (ref 65–99)
Glucose, Bld: 183 mg/dL — ABNORMAL HIGH (ref 65–99)
POTASSIUM: 2.9 mmol/L — AB (ref 3.5–5.1)
Potassium: 3.1 mmol/L — ABNORMAL LOW (ref 3.5–5.1)
SODIUM: 136 mmol/L (ref 135–145)
Sodium: 136 mmol/L (ref 135–145)

## 2017-11-21 LAB — KETONES, URINE: KETONES UR: NEGATIVE mg/dL

## 2017-11-21 MED ORDER — INSULIN GLARGINE 100 UNIT/ML ~~LOC~~ SOLN
35.0000 [IU] | Freq: Every morning | SUBCUTANEOUS | Status: DC
  Administered 2017-11-21 – 2017-11-23 (×3): 35 [IU] via SUBCUTANEOUS
  Filled 2017-11-21 (×3): qty 0.35

## 2017-11-21 MED ORDER — POTASSIUM CHLORIDE 20 MEQ PO PACK: 40.0000 meq | PACK | Freq: Two times a day (BID) | ORAL | Status: DC

## 2017-11-21 MED ORDER — POLYETHYLENE GLYCOL 3350 17 G PO PACK
17.0000 g | PACK | Freq: Every day | ORAL | Status: DC
  Administered 2017-11-21 – 2017-11-23 (×3): 17 g via ORAL
  Filled 2017-11-21 (×3): qty 1

## 2017-11-21 MED ORDER — SENNOSIDES-DOCUSATE SODIUM 8.6-50 MG PO TABS
1.0000 | ORAL_TABLET | Freq: Two times a day (BID) | ORAL | Status: DC
  Administered 2017-11-21 – 2017-11-23 (×4): 1 via ORAL
  Filled 2017-11-21 (×5): qty 1

## 2017-11-21 MED ORDER — INSULIN STARTER KIT- PEN NEEDLES (ENGLISH)
1.0000 | Freq: Once | Status: DC
  Filled 2017-11-21: qty 1

## 2017-11-21 MED ORDER — INSULIN ASPART 100 UNIT/ML ~~LOC~~ SOLN
0.0000 [IU] | Freq: Three times a day (TID) | SUBCUTANEOUS | Status: DC
  Administered 2017-11-21: 2 [IU] via SUBCUTANEOUS
  Administered 2017-11-21 – 2017-11-22 (×3): 3 [IU] via SUBCUTANEOUS
  Administered 2017-11-22: 7 [IU] via SUBCUTANEOUS
  Administered 2017-11-23: 5 [IU] via SUBCUTANEOUS
  Administered 2017-11-23: 3 [IU] via SUBCUTANEOUS

## 2017-11-21 MED ORDER — INSULIN ASPART 100 UNIT/ML ~~LOC~~ SOLN
12.0000 [IU] | Freq: Three times a day (TID) | SUBCUTANEOUS | Status: DC
  Administered 2017-11-21 – 2017-11-23 (×6): 12 [IU] via SUBCUTANEOUS

## 2017-11-21 MED ORDER — POTASSIUM CHLORIDE CRYS ER 20 MEQ PO TBCR
20.0000 meq | EXTENDED_RELEASE_TABLET | Freq: Two times a day (BID) | ORAL | Status: AC
  Administered 2017-11-21 (×2): 20 meq via ORAL
  Filled 2017-11-21 (×2): qty 1

## 2017-11-21 NOTE — Progress Notes (Signed)
Subjective: Shawn Anthony slept rather well last night, "I needed that".  He feels "okay" today.  He feels and seems more alert.  He ate the meal provided last evening.  Review of systems:  No vomiting, no diarrhea.  Objective: Vital signs in last 24 hours: Temp:  [98.6 F (37 C)-99.9 F (37.7 C)] 99.3 F (37.4 C) (01/18 0100) Pulse Rate:  [89-113] 95 (01/18 0100) Resp:  [13-24] 16 (01/18 0100) BP: (107-165)/(54-132) 115/54 (01/18 0100) SpO2:  [97 %-100 %] 98 % (01/18 0100) Weight change:  Last BM Date: 11/16/17  Intake/Output from previous day: 01/17 0701 - 01/18 0700 In: 3368.3 [P.O.:240; I.V.:2328.3; IV Piggyback:800] Out: 2825 [Urine:2825] Intake/Output this shift: Total I/O In: 2046.9 [P.O.:240; I.V.:1206.9; IV Piggyback:600] Out: 1800 [Urine:1800]  General: No apparent distress, more alert today. Eyes: Anicteric, pupils equal and round. Neck: Supple, trachea midline. Thyroid: No appreciable enlargement, mobile without fixation, no tenderness. Cardiovascular: Regular rhythm and rate, no murmur, normal radial pulse. Respiratory: Normal respiratory effort, clear to auscultation over anterior lung fields. Gastrointestinal: Normal pitch active bowel sounds, nontender abdomen without distention or appreciable hepatomegaly. Neurologic: Cranial nerves normal as tested. Musculoskeletal: Normal muscle tone, no muscle atrophy. Skin: Appropriate warmth, no visible rash. Mental status: Alert, conversant, speech clear, thought logical, appropriate mood and affect, no hallucinations or delusions evident. Hematologic/lymphatic: No cervical adenopathy, no jaundice.  Lab Results: Recent Labs    11/20/17 0252 11/21/17 0020  WBC 9.0 7.1  HGB 14.2 13.7  HCT 40.7 38.3*  PLT 174 165   BMET Recent Labs    11/21/17 0020 11/21/17 0319  NA 136 136  K 2.9* 3.1*  CL 113* 114*  CO2 17* 18*  GLUCOSE 183* 164*  BUN 13 12  CREATININE 0.58* 0.55*  CALCIUM 8.0* 8.0*   Beta-hydroxybutyrate  0.65 mmol/L on 11/20/2017.  Medications:  I have reviewed the patient's current medications. Scheduled: . enoxaparin (LOVENOX) injection  40 mg Subcutaneous Daily  . mouth rinse  15 mL Mouth Rinse BID  . pantoprazole  40 mg Oral Daily  . potassium chloride SA  20 mEq Oral BID  . simvastatin  10 mg Oral QHS   Continuous: . D-10-0.45% Sodium Chloride with KCL 40 meq/L 1000 ml 125 mL/hr at 11/21/17 0622  . insulin (NOVOLIN-R) infusion 7.2 Units/hr (11/21/17 0543)    Assessment/Plan: 1.  Diabetic ketoacidosis (normal anion gap, improving bicarbonate level, and almost normal beta-hydroxybutyrate level when checked over 12 hours ago) 2.  Diabetes mellitus with hyperglycemia (much better glycemic control now). 3.  Hepatic steatosis. 4.  Severe obesity. 5.  Hypokalemia--managed by hospitalist team.  His anion gap is normal and his total CO2 has risen above 15.  So he may be able to transition from intravenous insulin to multiple daily subcutaneous insulin injections.  I will leave this to the discretion of the hospitalist team.  Please see my consult note from yesterday regarding suggested doses for an insulin-naive patient of his body weight (which provides about 0.5 units of insulin per kilogram of body weight per day).   Even those doses are less generous than his current intravenous insulin requirements.  The doses may be gradually adjusted for appropriate glycemic control.  In the short term, insulin therapy and adequate water intake should reduce his risk of ketoacidosis--even if his glycemic control is not ideal at first.  Shawn Anthony agreed to drink plenty of water.  Continue multiple daily subcutaneous insulin injections when he returns home. Schedule an office visit with me within one week  after his hospital discharge.     LOS: 5 days   Dhairya Corales,[place] 11/21/2017, 6:46 AM

## 2017-11-21 NOTE — Progress Notes (Signed)
Note patient transitioned off the IV Insulin drip this afternoon to Lantus and Novolog.    Note that Dr. Buddy Duty (Endocrinology) would like to have pt discharged home on Insulin.   Discussed with pt taking insulin at home.  Explained what Lantus and Novolog insulins are, how they work, when to take, etc.    Educated patient and on insulin pen use at home.  Reviewed contents of insulin flexpen starter kit.  Reviewed all steps of insulin pen including attachment of needle, 2-unit air shot, dialing up dose, giving injection, removing needle, disposal of sharps, storage of unused insulin, disposal of insulin etc.  Patient able to provide successful return demonstration.  Reviewed troubleshooting with insulin pen.  Also reviewed Signs/Symptoms of Hypoglycemia with patient and how to treat Hypoglycemia at home.    Have asked RNs caring for patient to please allow patient to give all injections here in hospital as much as possible for practice.    MD to give patient Rxs for insulin pens and insulin pen needles.     Lantus Insulin pen- Order # [id number]  Novolog Insulin pen- Order # [id number]  Insulin pen needles- Order # [id number]    --Will follow patient during hospitalization--  Wyn Quaker RN, MSN, CDE Diabetes Coordinator Inpatient Glycemic Control Team Team Pager: [phone] (8a-5p)

## 2017-11-21 NOTE — Progress Notes (Signed)
PROGRESS NOTE   Shawn Anthony  CWU:[id number] DOB: 1966-09-27 DOA: 11/16/2017   PCP: Kathyrn Lass, MD   Brief Narrative:  Patient's 52 year old male with history of diabetes type 2 and hyperlipidemia, presented to emergency department with main concern of one week duration of progressively worsening nausea and vomiting, poor oral intake. Also note, patient had a recent change in antihyperglycemic regimen due to insurance coverage, has not been on insulin therapy before. On admission, patient noted to be in DKA with bicarbonate less than 7 and glucose greater than 300. Patient was admitted to step down unit for further evaluation and management.  Addendum: Spoke with pt's spouse this AM, he reports patient has never been in DKA. Was on Invokana for two years in the past and was taken off of it for about a year. Pt was placed on Glipizide and Metformin and was doing OK for a while. Vania Rea was started on January 10th, 2019. Spouse says pt drinks no alcohol and does not take any drugs, has not been taking any NSAID's or aspirin. Pt's spouse says pt is on several OTC supplements and vitamins.   Assessment & Plan:   Principal Problem:   DKA (diabetic ketoacidoses) ([place]) - Suspected to be due to empagliflozin but still somewhat unclear  - IVF have been changed to D10 1/2 NS as was recommended - Dr. Buddy Duty has graciously agreed to follow this pt and his help is greatly appreciated  - bicarb trending up, AG closed - transition to Lantus 35 U QD, also added Insulin Novolog 12 U TID - OK to transfer out of SDU  - advance diet - BMP In AM  Active Problems:   Hypokalemia - continue to supplement - BMP In AM    Leukocytosis - Suspect to be reactive from acute illness outlined above - no evidence of an infectious etiology - CBC In AM    Tachycardia - Also reactive - improving - will transfer to tele unit     Dyslipidemia - Continue statin    GERD - Continue pantoprazole    Morbid  obesity - Body mass index is 40.26 kg/m.   DVT prophylaxis: enoxaparin Code Status: full Family Communication:  pt at bedside  Disposition Plan:  transfer to tele   Consultants:   PCCM  Endocrinology   Procedures:    None  Antimicrobials:     None  Subjective: Pt reports feeling better.   Objective: Vitals:   11/21/17 0600 11/21/17 0700 11/21/17 0800 11/21/17 0900  BP: 130/73 114/89 136/81 137/72  Pulse: 92 96 (!) 104 (!) 109  Resp: 16 (!) '21 12 19  ' Temp: 99.1 F (37.3 C) 99 F (37.2 C) 99.1 F (37.3 C) 99.3 F (37.4 C)  TempSrc:      SpO2: 97% 98% 100% 100%  Weight:      Height:        Intake/Output Summary (Last 24 hours) at 11/21/2017 1144 Last data filed at 11/21/2017 0901 Gross per 24 hour  Intake 5485.29 ml  Output 2725 ml  Net 2760.29 ml   Filed Weights   11/17/17 0001 11/17/17 0030  Weight: 99.8 kg (220 lb) (!) 138.4 kg (305 lb 1.9 oz)    Physical Exam  Constitutional: Appears well-developed and well-nourished. No distress.  Pulmonary: Effort and breath sounds normal, no stridor, rhonchi, wheezes, rales.  Abdominal: Soft. BS +,  no distension, tenderness, rebound or guarding.  Musculoskeletal: Normal range of motion. +1 bilateral LE edema Lymphadenopathy: No lymphadenopathy noted, cervical,  inguinal. Neuro: Alert. Normal reflexes, muscle tone coordination. No cranial nerve deficit. Skin: Skin is warm and dry. No rash noted. Not diaphoretic. No erythema. No pallor.  Psychiatric: Normal mood and affect. Behavior, judgment, thought content normal.    Data Reviewed: I have personally reviewed following labs and imaging studies  CBC: Recent Labs  Lab 11/16/17 1959 11/17/17 0517 11/18/17 0319 11/20/17 0252 11/21/17 0020  WBC 24.9* 22.2* 17.5* 9.0 7.1  NEUTROABS 21.8*  --  14.9*  --   --   HGB 17.0 15.6 15.4 14.2 13.7  HCT 50.5 45.3 45.6 40.7 38.3*  MCV 98.1 96.4 97.6 93.8 89.7  PLT 385 306 308 174 226   Basic Metabolic  Panel: Recent Labs  Lab 11/19/17 1106  11/20/17 1237 11/20/17 1546 11/20/17 2024 11/21/17 0020 11/21/17 0319  NA 139   < > 136 137 136 136 136  K 2.8*   < > 2.9* 2.7* 2.9* 2.9* 3.1*  CL 114*   < > 114* 113* 112* 113* 114*  CO2 10*   < > 14* 18* 17* 17* 18*  GLUCOSE 151*   < > 173* 165* 232* 183* 164*  BUN 26*   < > '18 17 14 13 12  ' CREATININE 0.99   < > 0.62 0.60* 0.64 0.58* 0.55*  CALCIUM 7.9*   < > 8.2* 8.2* 8.2* 8.0* 8.0*  MG 2.4  --   --   --   --   --   --    < > = values in this interval not displayed.   Liver Function Tests: Recent Labs  Lab 11/16/17 1959  AST 17  ALT 23  ALKPHOS 98  BILITOT 1.4*  PROT 9.1*  ALBUMIN 4.3   Recent Labs  Lab 11/16/17 1959  LIPASE 20   Cardiac Enzymes: Recent Labs  Lab 11/17/17 0024 11/17/17 0517 11/17/17 0903  TROPONINI <0.03 <0.03 <0.03   CBG: Recent Labs  Lab 11/21/17 0647 11/21/17 0755 11/21/17 0900 11/21/17 1014 11/21/17 1120  GLUCAP 158* 165* 216* 175* 176*   Radiology Studies: I have reviewed all of the imaging studies as well as blood work during this hospital stay   Scheduled Meds: . enoxaparin (LOVENOX) injection  40 mg Subcutaneous Daily  . insulin aspart  0-9 Units Subcutaneous TID WC  . insulin aspart  12 Units Subcutaneous TID WC  . insulin glargine  35 Units Subcutaneous q morning - 10a  . insulin starter kit- pen needles  1 kit Other Once  . mouth rinse  15 mL Mouth Rinse BID  . pantoprazole  40 mg Oral Daily  . polyethylene glycol  17 g Oral Daily  . potassium chloride SA  20 mEq Oral BID  . senna-docusate  1 tablet Oral BID  . simvastatin  10 mg Oral QHS   Continuous Infusions: . D-10-0.45% Sodium Chloride with KCL 40 meq/L 1000 ml 125 mL/hr at 11/21/17 0901     LOS: 5 days    Faye Ramsay, MD [facility] Pager [phone]  25 minutes spent

## 2017-11-21 NOTE — Progress Notes (Signed)
Patients labs reviewed which are improved (note CO2 18 on BMP). Clinically he is overall improved / mental status nearing baseline.  He remains on dextrose infusion, this will need to be monitored closely for hyper / hypoglycemia trend with initiation of long acting insulin.  Will defer management to Dr. Izola PriceMyers / Dr. [name].  Please call back if new needs arise.    Shawn BrimBrandi Kristyana Notte, NP-C [facility] Pgr: [phone] or if no answer [phone] 11/21/2017, 12:08 PM

## 2017-11-22 LAB — GLUCOSE, CAPILLARY
GLUCOSE-CAPILLARY: 218 mg/dL — AB (ref 65–99)
GLUCOSE-CAPILLARY: 221 mg/dL — AB (ref 65–99)
Glucose-Capillary: 217 mg/dL — ABNORMAL HIGH (ref 65–99)
Glucose-Capillary: 325 mg/dL — ABNORMAL HIGH (ref 65–99)

## 2017-11-22 LAB — CBC
HCT: 37.4 % — ABNORMAL LOW (ref 39.0–52.0)
Hemoglobin: 13.5 g/dL (ref 13.0–17.0)
MCH: 32.6 pg (ref 26.0–34.0)
MCHC: 36.1 g/dL — AB (ref 30.0–36.0)
MCV: 90.3 fL (ref 78.0–100.0)
PLATELETS: 200 10*3/uL (ref 150–400)
RBC: 4.14 MIL/uL — ABNORMAL LOW (ref 4.22–5.81)
RDW: 13.9 % (ref 11.5–15.5)
WBC: 7.2 10*3/uL (ref 4.0–10.5)

## 2017-11-22 LAB — BASIC METABOLIC PANEL
Anion gap: 6 (ref 5–15)
BUN: 12 mg/dL (ref 6–20)
CO2: 19 mmol/L — ABNORMAL LOW (ref 22–32)
CREATININE: 0.65 mg/dL (ref 0.61–1.24)
Calcium: 7.9 mg/dL — ABNORMAL LOW (ref 8.9–10.3)
Chloride: 110 mmol/L (ref 101–111)
GFR calc Af Amer: >60 mL/min (ref >60)
GLUCOSE: 212 mg/dL — AB (ref 65–99)
POTASSIUM: 3.3 mmol/L — AB (ref 3.5–5.1)
SODIUM: 135 mmol/L (ref 135–145)

## 2017-11-22 MED ORDER — POTASSIUM CHLORIDE CRYS ER 20 MEQ PO TBCR
40.0000 meq | EXTENDED_RELEASE_TABLET | Freq: Once | ORAL | Status: AC
  Administered 2017-11-22: 40 meq via ORAL
  Filled 2017-11-22: qty 2

## 2017-11-22 NOTE — Progress Notes (Addendum)
PROGRESS NOTE   Shawn Anthony  DGL:[id number] DOB: December 21, 1965 DOA: 11/16/2017   PCP: Kathyrn Lass, MD   Brief Narrative:  Patient's 52 year old male with history of diabetes type 2 and hyperlipidemia, presented to emergency department with main concern of one week duration of progressively worsening nausea and vomiting, poor oral intake. Also note, patient had a recent change in antihyperglycemic regimen due to insurance coverage, has not been on insulin therapy before. On admission, patient noted to be in DKA with bicarbonate less than 7 and glucose greater than 300. Patient was admitted to step down unit for further evaluation and management.  Addendum: Spoke with pt's spouse this AM, he reports patient has never been in DKA. Was on Invokana for two years in the past and was taken off of it for about a year. Pt was placed on Glipizide and Metformin and was doing OK for a while. Vania Rea was started on January 10th, 2019. Spouse says pt drinks no alcohol and does not take any drugs, has not been taking any NSAID's or aspirin. Pt's spouse says pt is on several OTC supplements and vitamins.   Assessment & Plan:   Principal Problem:   DKA (diabetic ketoacidoses) ([place]) - Suspected to be due to empagliflozin but still somewhat unclear  - IVF have been changed to D10 1/2 NS as was recommended - Dr. Buddy Duty has graciously agreed to follow this pt and his help is greatly appreciated  - Now on sub Q insulin - transfer to floor today  Active Problems:   Hypokalemia - continue to supplement - supplement  - follow up BMP in am    Leukocytosis - Suspect to be reactive from acute illness outlined above - no evidence of an infectious etiology - follow up CBC in am    Tachycardia - Also reactive - stable     Dyslipidemia - Continue statin therapy     GERD - Continue pantoprazole    Morbid obesity - Body mass index is 40.26 kg/m.   DVT prophylaxis: Lovenox subQ Code Status: full Family  Communication:  no family at bedside Disposition Plan:  transfer to telemetry   Consultants:   PCCM  Endocrinology   Procedures:    None  Antimicrobials:     None  Subjective: No overnight events.  Objective: Vitals:   11/22/17 0700 11/22/17 0720 11/22/17 0800 11/22/17 1100  BP: 136/70  (!) 139/98   Pulse: 88  94   Resp: 14  17   Temp:  98.1 F (36.7 C)  98.2 F (36.8 C)  TempSrc:  Oral  Oral  SpO2: 100%  100%   Weight:      Height:        Intake/Output Summary (Last 24 hours) at 11/22/2017 1416 Last data filed at 11/22/2017 1100 Gross per 24 hour  Intake 125 ml  Output 2000 ml  Net -1875 ml   Filed Weights   11/17/17 0001 11/17/17 0030  Weight: 99.8 kg (220 lb) (!) 138.4 kg (305 lb 1.9 oz)    Physical Exam  Constitutional: Appears well-developed and well-nourished. No distress.  HENT: Normocephalic. External right and left ear normal. Oropharynx is clear and moist.  Eyes: Conjunctivae and EOM are normal. PERRLA, no scleral icterus.  Neck: Normal ROM. Neck supple. No JVD. No tracheal deviation. No thyromegaly.  CVS: RRR, S1/S2 + Pulmonary: Effort and breath sounds normal, no stridor, rhonchi, wheezes, rales.  Abdominal: Soft. BS +,  no distension, tenderness, rebound or guarding.  Musculoskeletal: Normal  range of motion. No edema and no tenderness.  Lymphadenopathy: No lymphadenopathy noted, cervical, inguinal. Neuro: Alert. Normal reflexes, muscle tone coordination. No cranial nerve deficit. Skin: Skin is warm and dry. No rash noted. Not diaphoretic. No erythema. No pallor.  Psychiatric: Normal mood and affect. Behavior, judgment, thought content normal.    Data Reviewed: I have personally reviewed following labs and imaging studies  CBC: Recent Labs  Lab 11/16/17 1959 11/17/17 0517 11/18/17 0319 11/20/17 0252 11/21/17 0020 11/22/17 0308  WBC 24.9* 22.2* 17.5* 9.0 7.1 7.2  NEUTROABS 21.8*  --  14.9*  --   --   --   HGB 17.0 15.6 15.4 14.2  13.7 13.5  HCT 50.5 45.3 45.6 40.7 38.3* 37.4*  MCV 98.1 96.4 97.6 93.8 89.7 90.3  PLT 385 306 308 174 165 503   Basic Metabolic Panel: Recent Labs  Lab 11/19/17 1106  11/20/17 1546 11/20/17 2024 11/21/17 0020 11/21/17 0319 11/22/17 0308  NA 139   < > 137 136 136 136 135  K 2.8*   < > 2.7* 2.9* 2.9* 3.1* 3.3*  CL 114*   < > 113* 112* 113* 114* 110  CO2 10*   < > 18* 17* 17* 18* 19*  GLUCOSE 151*   < > 165* 232* 183* 164* 212*  BUN 26*   < > _0 CREATININE 0.99   < > 0.60* 0.64 0.58* 0.55* 0.65  CALCIUM 7.9*   < > 8.2* 8.2* 8.0* 8.0* 7.9*  MG 2.4  --   --   --   --   --   --    < > = values in this interval not displayed.   Liver Function Tests: Recent Labs  Lab 11/16/17 1959  AST 17  ALT 23  ALKPHOS 98  BILITOT 1.4*  PROT 9.1*  ALBUMIN 4.3   Recent Labs  Lab 11/16/17 1959  LIPASE 20   Cardiac Enzymes: Recent Labs  Lab 11/17/17 0024 11/17/17 0517 11/17/17 0903  TROPONINI <0.03 <0.03 <0.03   CBG: Recent Labs  Lab 11/21/17 1521 11/21/17 1758 11/21/17 2102 11/22/17 0720 11/22/17 1107  GLUCAP 185* 247* 240* 217* 325*   Radiology Studies: I have reviewed all of the imaging studies as well as blood work during this hospital stay   Scheduled Meds: . enoxaparin (LOVENOX) injection  40 mg Subcutaneous Daily  . insulin aspart  0-9 Units Subcutaneous TID WC  . insulin aspart  12 Units Subcutaneous TID WC  . insulin glargine  35 Units Subcutaneous q morning - 10a  . insulin starter kit- pen needles  1 kit Other Once  . mouth rinse  15 mL Mouth Rinse BID  . pantoprazole  40 mg Oral Daily  . polyethylene glycol  17 g Oral Daily  . senna-docusate  1 tablet Oral BID  . simvastatin  10 mg Oral QHS   Continuous Infusions: . D-10-0.45% Sodium Chloride with KCL 40 meq/L 1000 ml Stopped (11/21/17 1525)     LOS: 6 days    Leisa Lenz, MD [facility] Pager [phone]  25 minutes spent

## 2017-11-22 NOTE — Progress Notes (Addendum)
Subjective: Shawn Anthony feels even better today.  He notes that he seems to "have his faculties" now, though he did not upon arrival at the hospital.  He reports constipation but Senakot helped.  Review of systems:  No vomiting, no diarrhea.  Objective: Vital signs in last 24 hours: Temp:  [98.1 F (36.7 C)-98.8 F (37.1 C)] 98.2 F (36.8 C) (01/19 1100) Pulse Rate:  [88-123] 94 (01/19 0800) Resp:  [13-21] 17 (01/19 0800) BP: (103-186)/(39-98) 139/98 (01/19 0800) SpO2:  [97 %-100 %] 100 % (01/19 0800) Weight change:  Last BM Date: 11/21/17  Intake/Output from previous day: 01/18 0701 - 01/19 0700 In: 1540.5 [I.V.:1540.5] Out: 1850 [Urine:1850] Intake/Output this shift: Total I/O In: -  Out: 400 [Urine:400]  General: No apparent distress, even more alert today. Eyes: Anicteric, pupils equal and round. Neck: Supple, trachea midline. Thyroid: No appreciable enlargement, mobile without fixation, no tenderness. Cardiovascular: Regular rhythm and rate, no murmur, normal radial pulse, bilateral edema in ankles. Respiratory: Normal respiratory effort, clear to auscultation over anterior lung fields. Gastrointestinal: Normal pitch active bowel sounds, nontender abdomen without distention or appreciable hepatomegaly. Neurologic: Cranial nerves normal as tested. Musculoskeletal: Normal muscle tone, no muscle atrophy. Skin: Appropriate warmth, no visible rash. Mental status: Alert, conversant, speech clear, thought logical, appropriate mood and affect, no hallucinations or delusions evident. Hematologic/lymphatic: No cervical adenopathy, no jaundice.  Lab Results: Recent Labs    11/21/17 0020 11/22/17 0308  WBC 7.1 7.2  HGB 13.7 13.5  HCT 38.3* 37.4*  PLT 165 200   BMET Recent Labs    11/21/17 0319 11/22/17 0308  NA 136 135  K 3.1* 3.3*  CL 114* 110  CO2 18* 19*  GLUCOSE 164* 212*  BUN 12 12  CREATININE 0.55* 0.65  CALCIUM 8.0* 7.9*   Beta-hydroxybutyrate 0.65 mmol/L on  11/20/2017.  Medications:  I have reviewed the patient's current medications. Scheduled: . enoxaparin (LOVENOX) injection  40 mg Subcutaneous Daily  . insulin aspart  0-9 Units Subcutaneous TID WC  . insulin aspart  12 Units Subcutaneous TID WC  . insulin glargine  35 Units Subcutaneous q morning - 10a  . insulin starter kit- pen needles  1 kit Other Once  . mouth rinse  15 mL Mouth Rinse BID  . pantoprazole  40 mg Oral Daily  . polyethylene glycol  17 g Oral Daily  . senna-docusate  1 tablet Oral BID  . simvastatin  10 mg Oral QHS   Continuous: . D-10-0.45% Sodium Chloride with KCL 40 meq/L 1000 ml Stopped (11/21/17 1525)    Assessment/Plan: 1.  Diabetes mellitus with hyperglycemia (better glycemic control than before his hospital admission). 2.  Diabetic ketoacidosis has resolved. 3.  Normal anion gap hyperchloremic acidosis, resolving. 4.  Hepatic steatosis. 5.  Severe obesity. 6.  Hypokalemia--managed by hospitalist team.  His anion gap is normal and his total CO2 has risen above 15.  He has transitioned from intravenous insulin to multiple daily subcutaneous insulin injections.    Consider increasing the insulin doses: Lantus (insulin glargine) increase from 35 units once a day to 42 units once a day, NovoLog (insulin aspart) increase from 12 units at meals to 14 units at meals, (while continuing the same NovoLog correction scale)  Continue multiple daily subcutaneous insulin injections when he returns home.  On November 14, 2017 just before his hospital admission, Shawn Anthony estimated average glucose was 301 mg/dL.    Shawn Anthony to an office visit with me arriving at 2:00PM on Tuesday,  November 25, 2017 at [facility] at [facility], [address].    As an outpatient, we can gradually adjust his insulin doses.   As an outpatient, we can check his endogenous insulin function and check for autoimmune diabetes mellitus (type 1 diabetes  mellitus or latent autoimmune diabetes of adults).    Discharge planning at the discretion of his hospitalist team.  (Shawn Anthony anticipates hospital discharge tomorrow.)     LOS: 6 days   Sarye Kath,Even 11/22/2017, 1:50 PM

## 2017-11-23 LAB — BASIC METABOLIC PANEL
Anion gap: 6 (ref 5–15)
BUN: 13 mg/dL (ref 6–20)
CO2: 22 mmol/L (ref 22–32)
Calcium: 8 mg/dL — ABNORMAL LOW (ref 8.9–10.3)
Chloride: 108 mmol/L (ref 101–111)
Creatinine, Ser: 0.64 mg/dL (ref 0.61–1.24)
GFR calc Af Amer: >60 mL/min (ref >60)
GFR calc non Af Amer: >60 mL/min (ref >60)
GLUCOSE: 233 mg/dL — AB (ref 65–99)
POTASSIUM: 3.8 mmol/L (ref 3.5–5.1)
Sodium: 136 mmol/L (ref 135–145)

## 2017-11-23 LAB — GLUCOSE, CAPILLARY
Glucose-Capillary: 226 mg/dL — ABNORMAL HIGH (ref 65–99)
Glucose-Capillary: 269 mg/dL — ABNORMAL HIGH (ref 65–99)

## 2017-11-23 LAB — CBC
HEMATOCRIT: 37.4 % — AB (ref 39.0–52.0)
HEMOGLOBIN: 13.2 g/dL (ref 13.0–17.0)
MCH: 32.1 pg (ref 26.0–34.0)
MCHC: 35.3 g/dL (ref 30.0–36.0)
MCV: 91 fL (ref 78.0–100.0)
Platelets: 249 10*3/uL (ref 150–400)
RBC: 4.11 MIL/uL — AB (ref 4.22–5.81)
RDW: 13.9 % (ref 11.5–15.5)
WBC: 9.2 10*3/uL (ref 4.0–10.5)

## 2017-11-23 MED ORDER — LORATADINE 10 MG PO TABS: 10.0000 mg | ORAL_TABLET | Freq: Every day | ORAL | Status: DC

## 2017-11-23 MED ORDER — LORATADINE 10 MG PO TABS
10.0000 mg | ORAL_TABLET | Freq: Every day | ORAL | Status: DC
  Administered 2017-11-23: 10 mg via ORAL
  Filled 2017-11-23: qty 1

## 2017-11-23 MED ORDER — INSULIN ASPART 100 UNIT/ML FLEXPEN: 14.0000 [IU] | PEN_INJECTOR | Freq: Three times a day (TID) | SUBCUTANEOUS | 1 refills | Status: AC

## 2017-11-23 MED ORDER — LORATADINE 10 MG PO TABS
10.0000 mg | ORAL_TABLET | Freq: Every day | ORAL | Status: DC
  Filled 2017-11-23: qty 1

## 2017-11-23 MED ORDER — INSULIN GLARGINE 100 UNITS/ML SOLOSTAR PEN: 42.0000 [IU] | PEN_INJECTOR | Freq: Every day | SUBCUTANEOUS | 1 refills | Status: DC

## 2017-11-23 NOTE — Discharge Instructions (Signed)

## 2017-11-23 NOTE — Discharge Summary (Signed)
Physician Discharge Summary  Shawn Anthony ZOX:[id number] DOB: 04/02/1966 DOA: 11/16/2017  PCP: Shawn Anthony, [name], MD  Admit date: 11/16/2017 Discharge date: 11/23/2017  Recommendations for Outpatient Follow-up:  Continue Lantus 42 units daily and novolog 14 units with meals  Follow up with Dr. Sharl Anthony on outpatient basis  Discharge Diagnoses:  Principal Problem:   DKA (diabetic ketoacidoses) (HCC) Active Problems:   Leukocytosis   Tachycardia    Discharge Condition: stable   Diet recommendation: as tolerated   History of present illness:  52 year old male with history of diabetes type 2 and hyperlipidemia, presented to emergency department with main concern of one week duration of progressively worsening nausea and vomiting, poor oral intake. Also note, patient had a recent change in antihyperglycemic regimen due to insurance coverage, has not been on insulin therapy before. On admission, patient noted to be in DKA with bicarbonate less than 7 and glucose greater than 300. Patient was admitted to step down unit for further evaluation and management.  Addendum: Spoke with pt's spouse this AM, he reports patient has never been in DKA. Was on Invokana for two years in the past and was taken off of it for about a year. Pt was placed on Glipizide and Metformin and was doing OK for a while. Shawn Anthony was started on January 10th, 2019. Spouse says pt drinks no alcohol and does not take any drugs, has not been taking any NSAID's or aspirin. Pt's spouse says pt is on several OTC supplements and vitamins.   Hospital Course:  Principal Problem:   DKA (diabetic ketoacidoses) (HCC) - Suspected to be due to empagliflozin but still somewhat unclear  - Continue Lantus 42 units daily and novolog 14 units with meals   Active Problems:   Hypokalemia - continue to supplement - supplemented and wnl    Leukocytosis - Suspect to be reactive from acute illness outlined above - no evidence of an  infectious etiology - wbc count wnl this am    Tachycardia - stable     Dyslipidemia - Continue statin therapy     GERD - Continue pantoprazole    Morbid obesity - Body mass index is 40.26 kg/m.   DVT prophylaxis:Lovenox subQ Code Status:full Family Communication:no family at the bedside this am   Consultants:  PCCM  Endocrinology   Procedures:   None  Antimicrobials:    None   Signed:  Manson PasseyAlma Yalexa Blust, MD  [facility] 11/23/2017, 9:44 AM  Pager #: [phone]  Time spent in minutes: more than 30 minutes   Discharge Exam: Vitals:   11/22/17 2155 11/23/17 0545  BP: (!) 121/53 127/64  Pulse: (!) 113 90  Resp: 20 18  Temp: 99.1 F (37.3 C) 97.9 F (36.6 C)  SpO2: 99% 99%   Vitals:   11/22/17 1100 11/22/17 1433 11/22/17 2155 11/23/17 0545  BP:  110/78 (!) 121/53 127/64  Pulse:  (!) 114 (!) 113 90  Resp:  20 20 18   Temp: 98.2 F (36.8 C) 98.1 F (36.7 C) 99.1 F (37.3 C) 97.9 F (36.6 C)  TempSrc: Oral Oral Oral Oral  SpO2:  99% 99% 99%  Weight:      Height:        General: Pt is alert, follows commands appropriately, not in acute distress Cardiovascular: Regular rate and rhythm, S1/S2 +, no murmurs Respiratory: Clear to auscultation bilaterally, no wheezing, no crackles, no rhonchi Abdominal: Soft, non tender, non distended, bowel sounds +, no guarding Extremities: no edema, no cyanosis, pulses palpable  bilaterally DP and PT Neuro: Grossly nonfocal  Discharge Instructions  Discharge Instructions    Call MD for:  persistant nausea and vomiting   Complete by:  As directed    Call MD for:  redness, tenderness, or signs of infection (pain, swelling, redness, odor or green/yellow discharge around incision site)   Complete by:  As directed    Call MD for:  severe uncontrolled pain   Complete by:  As directed    Diet - low sodium heart healthy   Complete by:  As directed    Discharge instructions   Complete by:  As  directed    Continue Lantus 42 units daily and novolog 14 units with meals  Follow up with Dr. Sharl Ma on outpatient basis   Increase activity slowly   Complete by:  As directed      Allergies as of 11/23/2017   No Known Allergies     Medication List    STOP taking these medications   glipiZIDE 5 MG tablet Commonly known as:  GLUCOTROL   JARDIANCE 10 MG Tabs tablet Generic drug:  empagliflozin   metFORMIN 500 MG tablet Commonly known as:  GLUCOPHAGE   multivitamin with minerals Tabs tablet     TAKE these medications   insulin aspart 100 UNIT/ML FlexPen Commonly known as:  NOVOLOG Inject 14 Units into the skin 3 (three) times daily with meals.   insulin glargine 100 unit/mL Sopn Commonly known as:  LANTUS Inject 0.42 mLs (42 Units total) into the skin daily.   omeprazole 20 MG capsule Commonly known as:  PRILOSEC Take 20 mg by mouth daily.   OVER THE COUNTER MEDICATION Take 1 tablet by mouth every other day.   simvastatin 10 MG tablet Commonly known as:  ZOCOR Take 10 mg by mouth at bedtime.       Follow-up Information    Shawn Hazel, MD. Schedule an appointment as soon as possible for a visit.   Specialty:  Family Medicine Contact information: [address] [place] [address] [phone]        Shawn Coin, MD. Schedule an appointment as soon as possible for a visit.   Specialty:  Endocrinology Contact information: [address] [place] [address] [phone]            The results of significant diagnostics from this hospitalization (including imaging, microbiology, ancillary and laboratory) are listed below for reference.    Significant Diagnostic Studies: Dg Chest 2 View  Result Date: 11/16/2017 CLINICAL DATA:  Chest and abdominal pain. EXAM: CHEST  2 VIEW COMPARISON:  Chest x-[place] 06/18/2014 FINDINGS: The cardiac silhouette, mediastinal and hilar contours are within normal limits and stable. The lungs are  clear. No pleural effusion. The bony thorax is intact. IMPRESSION: No acute cardiopulmonary findings. Electronically Signed   By: Rudie Meyer M.D.   On: 11/16/2017 23:24   Ct Abdomen Pelvis W Contrast  Result Date: 11/16/2017 CLINICAL DATA:  Diffuse abdominal pain worsening for 2 days. EXAM: CT ABDOMEN AND PELVIS WITH CONTRAST TECHNIQUE: Multidetector CT imaging of the abdomen and pelvis was performed using the standard protocol following bolus administration of intravenous contrast. CONTRAST:  100 cc ISOVUE-300 IOPAMIDOL (ISOVUE-300) INJECTION 61% COMPARISON:  None. FINDINGS: Lower chest: No significant pulmonary nodules or acute consolidative airspace disease. Small amount of fluid is noted in the lower thoracic esophagus. Hepatobiliary: Diffuse hepatic steatosis. No definite liver surface irregularity. No liver mass. Normal gallbladder with no radiopaque cholelithiasis. No biliary ductal  dilatation. Pancreas: Normal, with no mass or duct dilation. Spleen: Normal size. No mass. Adrenals/Urinary Tract: Normal adrenals. Normal kidneys with no hydronephrosis and no renal mass. Normal bladder. Stomach/Bowel: Stomach is mildly distended with fluid. No definite gastric wall thickening. Normal caliber small bowel with no small bowel wall thickening. Normal appendix. Moderate stool throughout the large bowel. No large bowel wall thickening or pericolonic fat stranding. Vascular/Lymphatic: Normal caliber abdominal aorta. Patent portal, splenic, hepatic and renal veins. No pathologically enlarged lymph nodes in the abdomen or pelvis. Reproductive: Normal size prostate. Other: No pneumoperitoneum, ascites or focal fluid collection. Musculoskeletal: No aggressive appearing focal osseous lesions. Mild thoracolumbar spondylosis. IMPRESSION: 1. No evidence of bowel obstruction or acute bowel inflammation. Normal appendix. 2. Stomach is mildly distended with fluid. No definite gastric wall thickening. 3. Fluid in the lower  thoracic esophagus, suggesting esophageal dysmotility and/or gastroesophageal reflux. 4. Diffuse hepatic steatosis. Electronically Signed   By: Delbert Phenix M.D.   On: 11/16/2017 22:34    Microbiology: Recent Results (from the past 240 hour(s))  MRSA PCR Screening     Status: None   Collection Time: 11/17/17 12:22 AM  Result Value Ref Range Status   MRSA by PCR NEGATIVE NEGATIVE Final    Comment:        The GeneXpert MRSA Assay (FDA approved for NASAL specimens only), is one component of a comprehensive MRSA colonization surveillance program. It is not intended to diagnose MRSA infection nor to guide or monitor treatment for MRSA infections.      Labs: Basic Metabolic Panel: Recent Labs  Lab 11/19/17 1106  11/20/17 2024 11/21/17 0020 11/21/17 0319 11/22/17 0308 11/23/17 0443  NA 139   < > 136 136 136 135 136  K 2.8*   < > 2.9* 2.9* 3.1* 3.3* 3.8  CL 114*   < > 112* 113* 114* 110 108  CO2 10*   < > 17* 17* 18* 19* 22  GLUCOSE 151*   < > 232* 183* 164* 212* 233*  BUN 26*   < > 14 13 12 12 13   CREATININE 0.99   < > 0.64 0.58* 0.55* 0.65 0.64  CALCIUM 7.9*   < > 8.2* 8.0* 8.0* 7.9* 8.0*  MG 2.4  --   --   --   --   --   --    < > = values in this interval not displayed.   Liver Function Tests: Recent Labs  Lab 11/16/17 1959  AST 17  ALT 23  ALKPHOS 98  BILITOT 1.4*  PROT 9.1*  ALBUMIN 4.3   Recent Labs  Lab 11/16/17 1959  LIPASE 20   No results for input(s): AMMONIA in the last 168 hours. CBC: Recent Labs  Lab 11/16/17 1959  11/18/17 0319 11/20/17 0252 11/21/17 0020 11/22/17 0308 11/23/17 0443  WBC 24.9*   < > 17.5* 9.0 7.1 7.2 9.2  NEUTROABS 21.8*  --  14.9*  --   --   --   --   HGB 17.0   < > 15.4 14.2 13.7 13.5 13.2  HCT 50.5   < > 45.6 40.7 38.3* 37.4* 37.4*  MCV 98.1   < > 97.6 93.8 89.7 90.3 91.0  PLT 385   < > 308 174 165 200 249   < > = values in this interval not displayed.   Cardiac Enzymes: Recent Labs  Lab 11/17/17 0024  11/17/17 0517 11/17/17 0903  TROPONINI <0.03 <0.03 <0.03   BNP: BNP (last 3 results)  No results for input(s): BNP in the last 8760 hours.  ProBNP (last 3 results) No results for input(s): PROBNP in the last 8760 hours.  CBG: Recent Labs  Lab 11/22/17 0720 11/22/17 1107 11/22/17 1633 11/22/17 2204 11/23/17 0744  GLUCAP 217* 325* 218* 221* 226*

## 2017-11-23 NOTE — Progress Notes (Signed)
Nurse reviewed discharge instructions with pt.  Pt verbalized understanding of discharge instructions, follow up appointment and new medications.  Prescriptions given to pt prior to discharge. 

## 2017-12-03 NOTE — Telephone Encounter (Signed)
Chart entered in error

## 2019-05-31 IMAGING — CR DG CHEST 2V
2 series · 2 of 2 positions shown · non-contrast
Comparison: Chest x-ray 06/18/2014

CLINICAL DATA: Chest and abdominal pain.

EXAM:
CHEST  2 VIEW

[w chest pa]
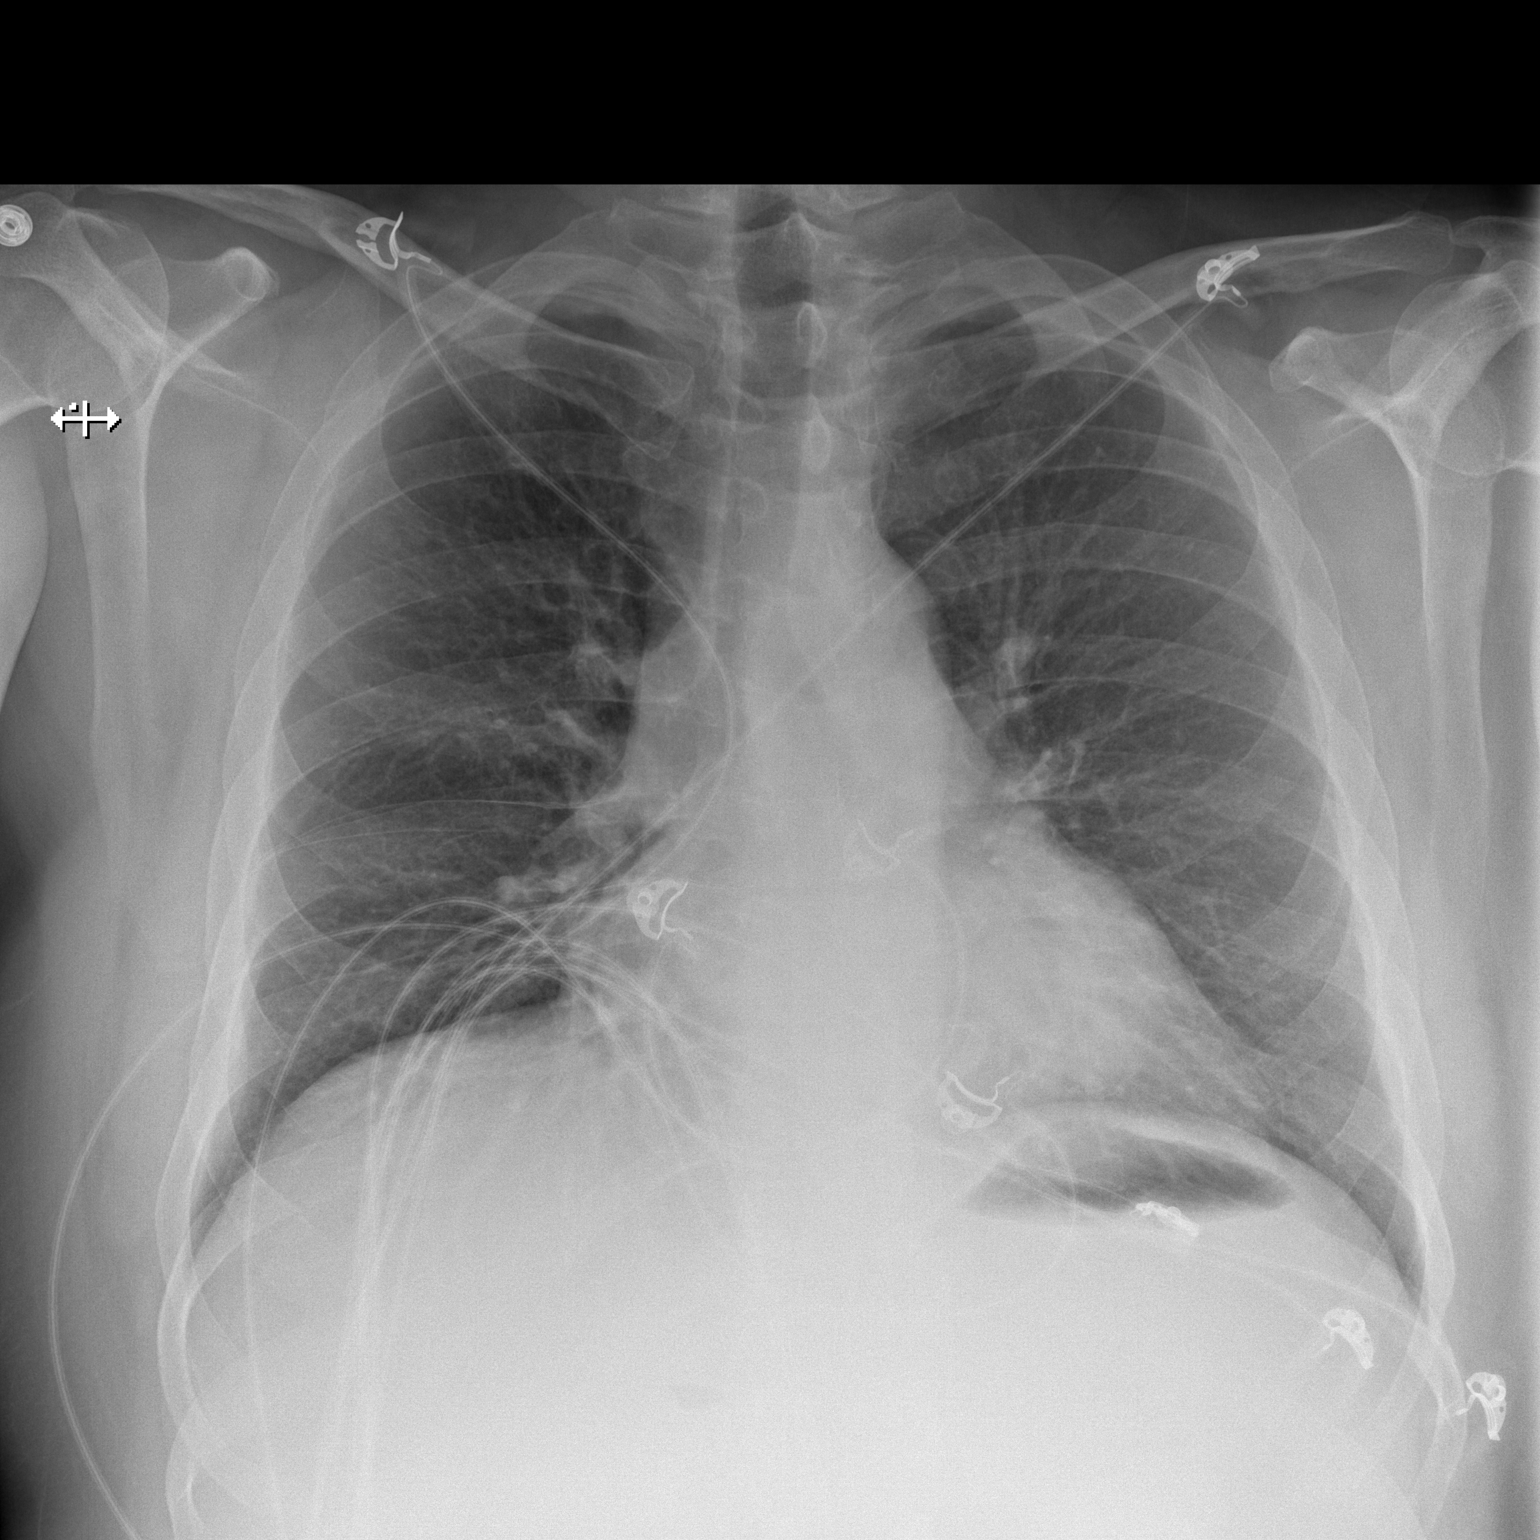

[w chest lat]
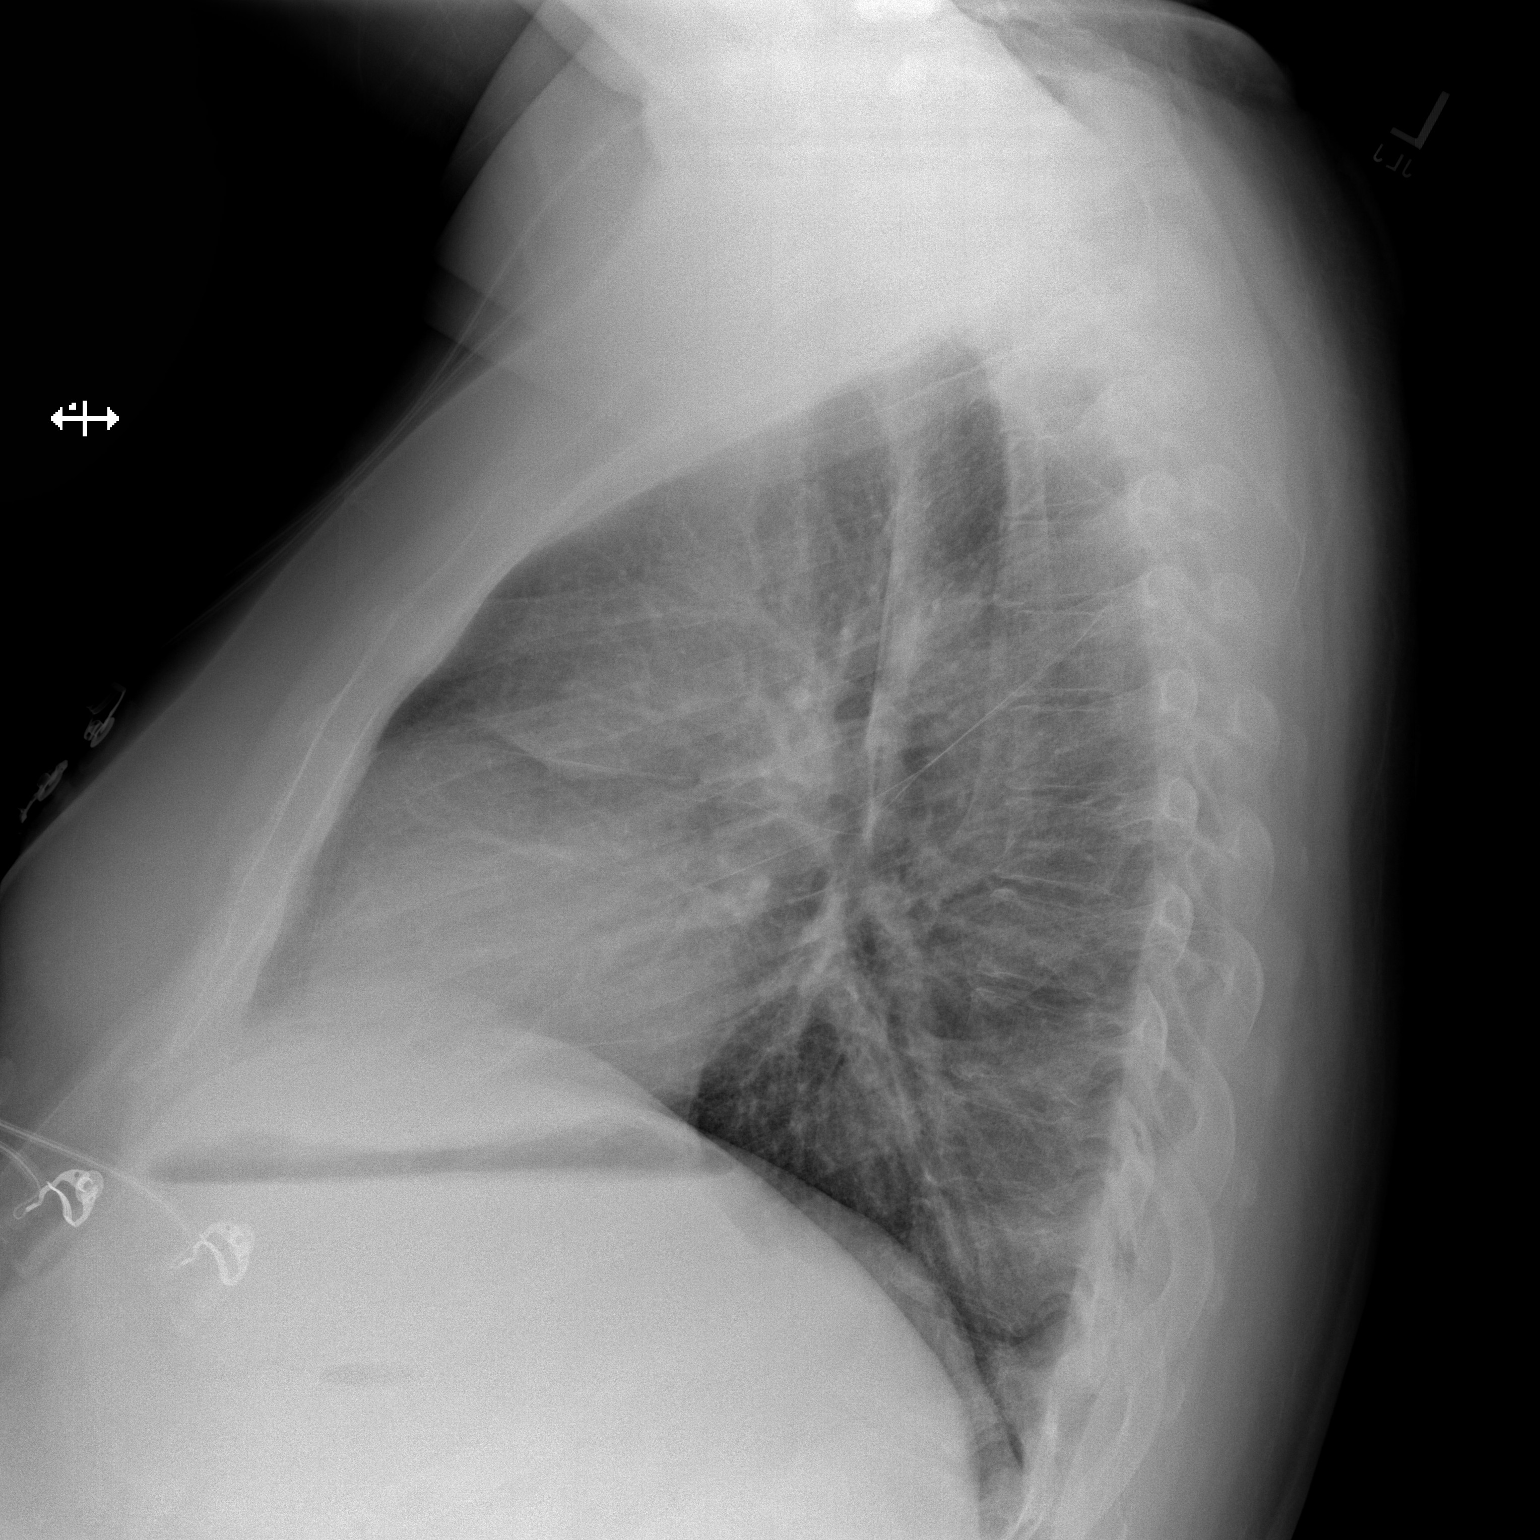

[2 of 2 positions shown; findings below may reference images not displayed]

FINDINGS: The cardiac silhouette, mediastinal and hilar contours are within
normal limits and stable. The lungs are clear. No pleural effusion.
The bony thorax is intact.
IMPRESSION: No acute cardiopulmonary findings.

## 2020-11-24 ENCOUNTER — Ambulatory Visit: Payer: BC Managed Care – PPO | Admitting: Neurology

## 2023-02-05 ENCOUNTER — Encounter: Payer: Self-pay | Admitting: *Deleted

## 2023-02-06 ENCOUNTER — Ambulatory Visit: Payer: BC Managed Care – PPO | Admitting: Neurology

## 2023-02-06 ENCOUNTER — Encounter: Payer: Self-pay | Admitting: Neurology

## 2023-02-06 VITALS — BP 128/67 | HR 85 | Ht 72.0 in | Wt 270.8 lb

## 2023-02-06 DIAGNOSIS — R251 Tremor, unspecified: Secondary | ICD-10-CM | POA: Diagnosis not present

## 2023-02-06 DIAGNOSIS — G250 Essential tremor: Secondary | ICD-10-CM

## 2023-02-06 DIAGNOSIS — G252 Other specified forms of tremor: Secondary | ICD-10-CM

## 2023-02-06 NOTE — Patient Instructions (Signed)
You have a rather mild tremor of both hands, also an intermittent mild head tremor.  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  You also have several changes that can be seen in patients with Parkinson's disease but I do not think you have classic Parkinson's disease, we will continue to monitor your exam and your symptoms.  I would not recommend any new medications quite yet.  We will reevaluate after your brain MRI.  I would like to proceed with a brain MRI with and without contrast to rule out a structural cause of your tremor.  Please try to hydrate well with water, continue to avoid or limit caffeine.   Please remember, that any kind of tremor may be exacerbated by anxiety, anger, nervousness, excitement, dehydration, sleep deprivation, thyroid dysfunction, by caffeine, and low blood sugar values or blood sugar fluctuations. Some medications can exacerbate tremors, this includes certain asthma or COPD medications and certain antidepressants.

## 2023-02-06 NOTE — Progress Notes (Signed)
Subjective:    Patient ID: Shawn Anthony is a 57 y.o. male.  HPI    Shawn Age, MD, PhD [facility] [address] [place], [place] [address]  Dear Dr. Orland Anthony,  I saw your patient, Shawn Anthony, upon your kind request in my neurologic clinic today for initial consultation of his tremor.  The patient is accompanied by his spouse, Shawn Anthony, today.  As you know, Shawn Anthony is a 58 year old male with an underlying medical history of diabetes, hypertension, history of diabetic ketoacidosis, and obesity, who reports a several year history of intermittent hand tremors, for started on the right hand but since he is left-handed he notices it probably more on the left hand he feels.  He recalls that his mom has a tremor, she is in her early 70s.  No other family history of tremors, no family history of Parkinson's disease.  He does not take any psychotropic medications, in particular, no antidepressant medications, no antipsychotic medications, no nausea medicine.  He has not fallen, has not noticed much in the way of balance issues.  Tremor is variable in intensity, most days he does not get particularly bothered by it but he would like to get checked out for it.  He avoids caffeine, he tries to hydrate well but his spouse believes that he does not drink enough water.  Patient does not drink any alcohol.  He is a non-smoker.  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  They are getting ready to spend 3 months in [place] between May and August.  He lives with his spouse.  They do not sleep in the same bedroom because he is a restless sleeper but denies any snoring, gasping sensations and witnessed apneas.  He has never had a sleep study. I reviewed your office note from 12/05/2022.  He had blood work through your office at the time including TSH, A1c, CBC with differential, CMP, PSA.  Blood  test results are not available for my review today.  We will request blood test results from your office.  He feels like he noticed the tremor first after he was hospitalized for DKA.  He has never had a brain scan.  He denies any severe headaches.  He denies any sudden onset of one-sided weakness or numbness or tingling or droopy face or slurring of speech.  His Past Medical History Is Significant For: Past Medical History:  Diagnosis Date   Diabetes mellitus    Hyperlipidemia     His Past Surgical History Is Significant For: Past Surgical History:  Procedure Laterality Date   SHOULDER SURGERY Right    2014    His Family History Is Significant For: Family History  Problem Relation Anthony of Onset   Diabetes Mother    Diabetes Sister    Healthy Brother     His Social History Is Significant For: Social History   Socioeconomic History   Marital status: Single    Spouse name: Not on file   Number of children: Not on file   Years of education: Not on file   Highest education level: Not on file  Occupational History   Not on file  Tobacco Use   Smoking status: Never   Smokeless tobacco: Never  Vaping Use   Vaping Use: Never used  Substance and Sexual Activity   Alcohol use: No  Drug use: No   Sexual activity: Not Currently  Other Topics Concern   Not on file  Social History Narrative   Caffiene Iced tea on occasionally.  Coffee occasionally.   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    Social Determinants of Health   Financial Resource Strain: Not on file  Food Insecurity: Not on file  Transportation Needs: Not on file  Physical Activity: Not on file  Stress: Not on file  Social Connections: Not on file    His Allergies Are:  Allergies  Allergen Reactions   Jardiance [Empagliflozin]     ketiacidosis  :   His Current Medications Are:  Outpatient Encounter Medications as of 02/06/2023  Medication Sig   Cholecalciferol (VITAMIN D3) 25 MCG  (1000 UT) tablet Take 1,000 Units by mouth daily.   Dulaglutide (TRULICITY) A999333 [id number] SOPN Inject 0.75 mg into the skin once a week.   insulin aspart (NOVOLOG) 100 UNIT/ML FlexPen Inject 14 Units into the skin 3 (three) times daily with meals. (Patient taking differently: Inject 6 Units into the skin 3 (three) times daily with meals.)   insulin degludec (TRESIBA) 100 UNIT/ML FlexTouch Pen Inject 26 Units into the skin daily.   metFORMIN (GLUCOPHAGE-XR) 500 MG 24 hr tablet Take 500 mg by mouth 2 (two) times daily with a meal.   OVER THE COUNTER MEDICATION Take 1 tablet by mouth every other day.   Pyridoxine HCl (VITAMIN B-6 PO) Take by mouth.   simvastatin (ZOCOR) 20 MG tablet Take 20 mg by mouth daily at 6 PM.   vitamin B-12 (CYANOCOBALAMIN) 250 MCG tablet Take 250 mcg by mouth daily.   [DISCONTINUED] b complex vitamins capsule Take 1 capsule by mouth daily.   [DISCONTINUED] insulin glargine (LANTUS) 100 unit/mL SOPN Inject 0.42 mLs (42 Units total) into the skin daily. (Patient not taking: Reported on 02/06/2023)   [DISCONTINUED] omeprazole (PRILOSEC) 20 MG capsule Take 20 mg by mouth daily. (Patient not taking: Reported on 02/05/2023)   No facility-administered encounter medications on file as of 02/06/2023.  :   Review of Systems:  Out of a complete 14 point review of systems, all are reviewed and negative with the exception of these symptoms as listed below:  Review of Systems  Neurological:        Intermittent hand tremors.  Constant. Since 2019.      Objective:  Neurological Exam  Physical Exam Physical Examination:   Vitals:   02/06/23 1453  BP: 128/67  Pulse: 85    General Examination: The patient is a very pleasant 57 y.o. male in no acute distress. He appears well-developed and well-nourished and well groomed.   HEENT: Normocephalic, atraumatic, pupils are equal, round and reactive to light, extraocular tracking is well-preserved, corrective eyeglasses in place.  Face  is symmetric with normal facial animation to mildly masked facies.  Mild nuchal rigidity and intermittent head tremor noted.  Speech without dysarthria.  No voice tremor.  No carotid bruits on auscultation.  Airway examination reveals moderate mouth dryness, dentures in place.  Tongue protrudes centrally and palate elevates symmetrically.  No involuntary tongue movements, no orofacial dyskinesias.   Chest: Clear to auscultation without wheezing, rhonchi or crackles noted.  Heart: S1+S2+0, regular and normal without murmurs, rubs or gallops noted.   Abdomen: Soft, non-tender and non-distended.  Extremities: There is no pitting edema in the distal lower extremities bilaterally.   Skin: Warm and dry without trophic changes noted.   Musculoskeletal: exam reveals  no obvious joint deformities.   Neurologically:  Mental status: The patient is awake, alert and oriented in all 4 spheres. His immediate and remote memory, attention, language skills and fund of knowledge are appropriate. There is no evidence of aphasia, agnosia, apraxia or anomia. Speech is clear with normal prosody and enunciation. Thought process is linear. Mood is normal and affect is normal.  Cranial nerves II - XII are as described above under HEENT exam.  Motor exam: Normal bulk, strength and tone is noted.  There is an intermittent resting tremor which is very subtle in both the left and right hand, no resting tremor in the lower extremities.  He has a mild postural tremor in the left more than right hand and very mild action tremor in both upper extremities, no intention tremor.  On fine motor skills testing, mild impairment globally of finger taps, hand movements and rapid alternating patting and foot taps bilaterally.  No significant lateralization noted, with the exception of perhaps slightly worse on the left.     On 02/06/2023: On Archimedes spiral drawing he has mild trembling with the left and right hand.  Handwriting is slightly  tremulous, legible, not particularly small or micrographic.  Cerebellar testing: No dysmetria or intention tremor. There is no truncal or gait ataxia.  Sensory exam: intact to light touch in the upper and lower extremities.  Gait, station and balance: He stands without difficulty, stands initially slightly wider base, posture is Anthony-appropriate to mildly stooped for Anthony.  He walks with good stride length and pace, fairly preserved arm swing, perhaps slight reduction on the left.  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  He noticed the tremor first when he was in the hospital for DKA.  History and examination are not telltale for Parkinson's disease, subtle changes in keeping with parkinsonism are seen but not compelling enough to label as parkinsonism.  His history of tremor in his mom would support essential tremor.  He may have a mild form of essential tremor, has noticed some fluctuation with his blood sugar fluctuation as well.  For now, we mutually agreed to continue to monitor his symptoms and examination, I would like to proceed with a brain MRI with and without contrast to rule out a structural cause of his tremor.  We may consider a DaTscan down the road.  He is advised to continue to pursue a healthy lifestyle, try to stay better hydrated with water, limits caffeine or avoid caffeine which he is.  He is going to spend about 3 months in [place] between May and August with his spouse, we can consider a follow-up after they are back, in the interim, if he is able to get his MRI before he leaves, we will certainly call with the results to keep them posted.  We talked about common tremor triggers and alleviating factors.  I answered all their questions today and the  patient and his spouse were in agreement.   Thank you very much for allowing me to participate in the care of this nice patient. If I can be of any further assistance to you please do not hesitate to call me at [phone].  Sincerely,   Shawn Age, MD, PhD

## 2023-02-07 LAB — COMPREHENSIVE METABOLIC PANEL
ALT: 12 IU/L (ref 0–44)
AST: 15 IU/L (ref 0–40)
Albumin/Globulin Ratio: 1.6 (ref 1.2–2.2)
Albumin: 4.1 g/dL (ref 3.8–4.9)
Alkaline Phosphatase: 64 IU/L (ref 44–121)
BUN/Creatinine Ratio: 23 — ABNORMAL HIGH (ref 9–20)
BUN: 16 mg/dL (ref 6–24)
Bilirubin Total: 0.4 mg/dL (ref 0.0–1.2)
CO2: 25 mmol/L (ref 20–29)
Calcium: 9.2 mg/dL (ref 8.7–10.2)
Chloride: 103 mmol/L (ref 96–106)
Creatinine, Ser: 0.71 mg/dL — ABNORMAL LOW (ref 0.76–1.27)
Globulin, Total: 2.6 g/dL (ref 1.5–4.5)
Glucose: 116 mg/dL — ABNORMAL HIGH (ref 70–99)
Potassium: 4.5 mmol/L (ref 3.5–5.2)
Sodium: 141 mmol/L (ref 134–144)
Total Protein: 6.7 g/dL (ref 6.0–8.5)
eGFR: 106 mL/min/{1.73_m2} (ref >59)

## 2024-06-25 ENCOUNTER — Other Ambulatory Visit: Payer: Self-pay | Admitting: Urology

## 2024-06-25 DIAGNOSIS — R972 Elevated prostate specific antigen [PSA]: Secondary | ICD-10-CM

## 2024-07-12 ENCOUNTER — Encounter: Payer: Self-pay | Admitting: Urology

## 2024-08-07 ENCOUNTER — Other Ambulatory Visit

## 2024-12-08 ENCOUNTER — Other Ambulatory Visit

## 2024-12-20 ENCOUNTER — Other Ambulatory Visit
